# Patient Record
Sex: Male | Born: 1980 | Race: White | Hispanic: No | Marital: Single | Smoking: Never smoker
Health system: Southern US, Community
[De-identification: ages and names within clinical notes are randomized; demographics above are authoritative.]

## PROBLEM LIST (undated history)

## (undated) DIAGNOSIS — N454 Abscess of epididymis or testis: Secondary | ICD-10-CM

## (undated) DIAGNOSIS — I639 Cerebral infarction, unspecified: Secondary | ICD-10-CM

## (undated) DIAGNOSIS — I619 Nontraumatic intracerebral hemorrhage, unspecified: Secondary | ICD-10-CM

## (undated) DIAGNOSIS — M109 Gout, unspecified: Secondary | ICD-10-CM

## (undated) DIAGNOSIS — R569 Unspecified convulsions: Secondary | ICD-10-CM

## (undated) HISTORY — PX: AORTIC VALVE REPLACEMENT: SHX41

---

## 2003-07-15 ENCOUNTER — Other Ambulatory Visit: Payer: Self-pay

## 2004-04-27 ENCOUNTER — Ambulatory Visit: Payer: Self-pay | Admitting: Urology

## 2004-10-13 ENCOUNTER — Emergency Department: Payer: Self-pay | Admitting: Emergency Medicine

## 2004-10-13 ENCOUNTER — Other Ambulatory Visit: Payer: Self-pay

## 2005-02-19 ENCOUNTER — Other Ambulatory Visit: Payer: Self-pay

## 2005-02-19 ENCOUNTER — Emergency Department: Payer: Self-pay | Admitting: Emergency Medicine

## 2005-02-26 ENCOUNTER — Emergency Department: Payer: Self-pay | Admitting: Emergency Medicine

## 2005-03-04 ENCOUNTER — Emergency Department: Payer: Self-pay | Admitting: Emergency Medicine

## 2005-03-05 ENCOUNTER — Ambulatory Visit: Payer: Self-pay | Admitting: Internal Medicine

## 2006-07-06 ENCOUNTER — Emergency Department: Payer: Self-pay | Admitting: Internal Medicine

## 2006-12-29 ENCOUNTER — Emergency Department: Payer: Self-pay | Admitting: Emergency Medicine

## 2008-08-12 ENCOUNTER — Emergency Department: Payer: Self-pay | Admitting: Emergency Medicine

## 2011-01-15 ENCOUNTER — Emergency Department: Payer: Self-pay | Admitting: Emergency Medicine

## 2011-08-08 ENCOUNTER — Emergency Department: Payer: Self-pay | Admitting: Emergency Medicine

## 2011-08-08 LAB — CBC WITH DIFFERENTIAL/PLATELET
Basophil %: 0.4 %
Eosinophil #: 0.1 10*3/uL (ref 0.0–0.7)
Eosinophil %: 1 %
HCT: 43 % (ref 40.0–52.0)
HGB: 14.6 g/dL (ref 13.0–18.0)
Lymphocyte #: 1 10*3/uL (ref 1.0–3.6)
Lymphocyte %: 17.5 %
Monocyte #: 0.4 10*3/uL (ref 0.0–0.7)
Neutrophil #: 4.4 10*3/uL (ref 1.4–6.5)
Neutrophil %: 74.8 %
RBC: 5.05 10*6/uL (ref 4.40–5.90)
WBC: 5.8 10*3/uL (ref 3.8–10.6)

## 2012-07-08 ENCOUNTER — Emergency Department: Payer: Self-pay | Admitting: Emergency Medicine

## 2012-07-08 LAB — URINALYSIS, COMPLETE
Glucose,UR: NEGATIVE mg/dL (ref 0–75)
Ketone: NEGATIVE
Protein: 100
Specific Gravity: 1.017 (ref 1.003–1.030)
Squamous Epithelial: NONE SEEN

## 2012-07-08 LAB — CBC WITH DIFFERENTIAL/PLATELET
Basophil %: 0.7 %
Eosinophil #: 0 10*3/uL (ref 0.0–0.7)
Lymphocyte #: 0.9 10*3/uL — ABNORMAL LOW (ref 1.0–3.6)
Lymphocyte %: 7.3 %
MCV: 85 fL (ref 80–100)
Monocyte #: 0.8 x10 3/mm (ref 0.2–1.0)
Monocyte %: 7 %
RDW: 13.9 % (ref 11.5–14.5)

## 2012-07-08 LAB — BASIC METABOLIC PANEL
Chloride: 104 mmol/L (ref 98–107)
Co2: 25 mmol/L (ref 21–32)
EGFR (African American): >60
EGFR (Non-African Amer.): >60
Sodium: 137 mmol/L (ref 136–145)

## 2012-07-09 LAB — PROTIME-INR
INR: 9.1
Prothrombin Time: 68.9 secs — ABNORMAL HIGH (ref 11.5–14.7)

## 2012-10-18 LAB — CBC WITH DIFFERENTIAL/PLATELET
Basophil %: 1.3 %
Eosinophil #: 0.2 10*3/uL (ref 0.0–0.7)
Eosinophil %: 3.8 %
Lymphocyte #: 1.1 10*3/uL (ref 1.0–3.6)
MCH: 27.9 pg (ref 26.0–34.0)
MCV: 82 fL (ref 80–100)
Monocyte #: 0.4 x10 3/mm (ref 0.2–1.0)
Monocyte %: 6 %
Neutrophil #: 4.3 10*3/uL (ref 1.4–6.5)
Neutrophil %: 70.1 %
RBC: 4.96 10*6/uL (ref 4.40–5.90)
WBC: 6.1 10*3/uL (ref 3.8–10.6)

## 2012-10-18 LAB — URINALYSIS, COMPLETE
Ketone: NEGATIVE
Leukocyte Esterase: NEGATIVE
Nitrite: NEGATIVE
RBC,UR: 13 /HPF (ref 0–5)
Specific Gravity: 1.016 (ref 1.003–1.030)
Squamous Epithelial: NONE SEEN

## 2012-10-18 LAB — BASIC METABOLIC PANEL
BUN: 9 mg/dL (ref 7–18)
Chloride: 107 mmol/L (ref 98–107)
Co2: 27 mmol/L (ref 21–32)
Creatinine: 1.21 mg/dL (ref 0.60–1.30)
EGFR (Non-African Amer.): >60
Glucose: 101 mg/dL — ABNORMAL HIGH (ref 65–99)
Potassium: 4 mmol/L (ref 3.5–5.1)
Sodium: 140 mmol/L (ref 136–145)

## 2012-10-18 LAB — PROTIME-INR
INR: 1.2
Prothrombin Time: 15.7 secs — ABNORMAL HIGH (ref 11.5–14.7)

## 2012-10-19 ENCOUNTER — Ambulatory Visit: Payer: Self-pay | Admitting: Neurology

## 2012-10-19 ENCOUNTER — Observation Stay: Payer: Self-pay | Admitting: Specialist

## 2012-10-19 LAB — DRUG SCREEN, URINE
Amphetamines, Ur Screen: NEGATIVE (ref ?–1000)
Benzodiazepine, Ur Scrn: POSITIVE (ref ?–200)
Cannabinoid 50 Ng, Ur ~~LOC~~: NEGATIVE (ref ?–50)
Methadone, Ur Screen: NEGATIVE (ref ?–300)
Opiate, Ur Screen: NEGATIVE (ref ?–300)
Tricyclic, Ur Screen: NEGATIVE (ref ?–1000)

## 2012-10-20 LAB — CBC WITH DIFFERENTIAL/PLATELET
Basophil %: 0.9 %
Eosinophil #: 0.1 10*3/uL (ref 0.0–0.7)
Eosinophil %: 2.4 %
HCT: 37 % — ABNORMAL LOW (ref 40.0–52.0)
HGB: 12.5 g/dL — ABNORMAL LOW (ref 13.0–18.0)
MCHC: 33.7 g/dL (ref 32.0–36.0)
Monocyte #: 0.3 x10 3/mm (ref 0.2–1.0)
Monocyte %: 5.7 %
Neutrophil %: 67.2 %
Platelet: 157 10*3/uL (ref 150–440)
RDW: 12.9 % (ref 11.5–14.5)
WBC: 5.2 10*3/uL (ref 3.8–10.6)

## 2012-10-20 LAB — BASIC METABOLIC PANEL
Anion Gap: 7 (ref 7–16)
BUN: 9 mg/dL (ref 7–18)
Co2: 28 mmol/L (ref 21–32)
EGFR (African American): >60
EGFR (Non-African Amer.): >60
Osmolality: 282 (ref 275–301)
Potassium: 3.7 mmol/L (ref 3.5–5.1)
Sodium: 143 mmol/L (ref 136–145)

## 2012-10-20 LAB — URINE CULTURE

## 2012-10-20 LAB — PROTIME-INR: Prothrombin Time: 26.3 secs — ABNORMAL HIGH (ref 11.5–14.7)

## 2014-04-18 ENCOUNTER — Emergency Department: Payer: Self-pay | Admitting: Emergency Medicine

## 2014-09-03 NOTE — Consult Note (Signed)
PATIENT NAME:  Kyle Simmons, Kyle Simmons MR#:  [id number] DATE OF BIRTH:  01/13/1981  NEUROLOGY CONSULTATION  DATE OF CONSULTATION:  10/19/2012  CONSULTING PHYSICIAN:  Ajmal Kathan B. Alger SimonsSwisher, MD  REFERRING PHYSICIAN: Dr. Huey Bienenstockawood Elgergawy.  REASON FOR CONSULTATION:  Seizures.   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 He is followed by Dr. Sherryll BurgerShah as an outpatient for his seizures. He was previously on Dilantin for quite some time. However over the winter Dr. Sherryll BurgerShah transitioned him off Dilantin and onto Keppra. The patient is not sure why this was done. Apparently Dilantin had  given him good control of his generalized seizures, but he continued to have complex-partial seizures. Since he is also on Coumadin, there is a possibility that Dr. Sherryll BurgerShah wanted to transition him away from a medication that would have had an interaction with Coumadin. However, the patient did say that he did not have much trouble with his Coumadin levels in the past.   In any case he did have a generalized seizure in February, and Dr. Sherryll BurgerShah increased his Keppra and also added on a second antiepileptic drug. The patient did not get that filled due to insurance reasons. Apparently he does have insurance, but there were some problems with his coverage. He had had a second generalized tonic-clonic seizure on April 1st. He continues on Keppra 1500 mg b.i.d. and states that he is very compliant with that. He does not know the medication name that Dr. Sherryll BurgerShah wanted to start back in February. He states that sometimes he gets an aura with his seizures, but oftentimes not. His mother states that his complex-partial seizures  consist of him "spacing out." With his generalized tonic-clonic seizures he has neck extension, eyes rolling back into his head, flailing of his extremities. He also has a significant amount of postictal confusion.   In the Emergency Department the patient had a seizure after being loaded with 1500 mg of p.o. Keppra. After the seizure in the Emergency Department he was loaded on fosphenytoin.   REVIEW OF SYSTEMS: A complete 14-point review of systems is negative other than what is mentioned in the HPI.   PAST MEDICAL HISTORY:  1.  Congenital bicuspid aortic valve, status post prosthetic valve replacement, 1993.  2.  Left MCA infarct, 1996, felt to be an embolic event from a fall.  3.  Intracranial bleed in the past.  4.  Complex-partial seizures.   PAST SURGICAL HISTORY: Aortic valve replacement, age of 34.   HOME MEDICATIONS: Aspirin 81 mg daily, Coumadin, Tylenol, Keppra 1500 mg b.i.d.   ALLERGIES: No drug allergies.   FAMILY HISTORY: No family history of seizures.   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 No smoking, alcohol, or illicits.   PHYSICAL EXAMINATION: VITAL SIGNS: Temperature 98, pulse 70, respirations 18, blood pressure 93/54, pulse ox 95%  on room air.  GENERAL: Supine, well, no apparent distress.  CARDIOVASCULAR: Regular rate and rhythm.  RESPIRATORY: Clear anteriorly.  ABDOMEN: Soft.  SKIN: No rashes or lesions.   MENTAL STATUS: Alert and oriented x 4. Naming and repetition intact. Speech is fluent. No dysarthria.  CRANIAL NERVES: Pupils equal, round, and reactive to light. Full visual fields. Extraocular movements intact. No nystagmus. No facial  sensory deficits. Facial expressions are symmetric. Shoulder-shrug 5/5 bilaterally. Tongue midline.  MOTOR: No pronator drift. Strength is 5/5 throughout.  DEEP TENDON REFLEXES: 2+/4 throughout, with toes downgoing bilaterally.  COORDINATION: No ataxia or dysmetria with bilateral finger-nose or  heel-to-shin.  SENSATION: No deficits to light touch, temperature, pin prick throughout.  GAIT: Not assessed.   LABS: Urine tox screen is positive only for benzodiazepines.   Cardiac enzymes are negative.   INR is 1.2.   Urinalysis is negative.   BMP is within normal limits.   CBC is within normal limits.   IMAGING: Noncontrast head CT reveals a left parieto-occipital infarct with encephalomalacia that is chronic. There is no acute intracranial process.   Kyle Simmons is a 34 year old male who has a past medical history of a left MCA territory infarct, complex-partial seizures with and without secondary generalization, who presents with breakthrough seizures. I suspect that the etiology of the seizures is likely secondary to the cerebral infarction that he had in the past.   Reason for breakthrough seizures is unknown at this time given that he states he is compliant with Keppra and there is no evidence of any infection. I suspected that he likely has poor seizure control from Keppra monotherapy.   RECOMMENDATIONS: 1.  Initiate Depakote, with a loading dose of 1800 mg IV x 1 and maintenance dosing of 500 mg b.i.d.  2.  Continue Keppra 1500 mg b.i.d.  3.  He was reminded of the [place] law stating that he cannot drive until he is 6 months seizure-free and not until he has been cleared by the [facility].  4.  Continue Coumadin, with management by the hospitalist service.  5. He will need followup with Dr. Sherryll Burger in 1 to 2 weeks.  6.  He could potentially be discharged later today if he is eating and ambulating well.   Thank you for this consultation.    ____________________________ Sid Falcon Ireland Virrueta, MD cbs:dm D: 10/19/2012 12:14:24 ET T: 10/19/2012 14:19:28 ET JOB#: [id number]  cc: Fredrik Mogel B. Alger Simons, MD, <Dictator> Trellis Paganini MD ELECTRONICALLY SIGNED 11/18/2012 14:12

## 2014-09-03 NOTE — H&P (Signed)
PATIENT NAME:  Kyle Simmons, Kyle Simmons MR#:  [id number] DATE OF BIRTH:  10-07-1980  DATE OF ADMISSION:  10/19/2012  REFERRING PHYSICIAN:  Rebecka Apley, MD  PRIMARY CARE PHYSICIAN:  Jorje Guild. Beckey Downing, MD    CHIEF COMPLAINT:  Seizure.   HISTORY OF PRESENT ILLNESS: This is a 34 year old male with significant past medical history of complex partial seizures, history of congenital bicuspid aortic valve with prosthetic valve replacement in 1996, on warfarin, history of left MCA infarction in 1996, history of intracranial bleed in the past, who presents with seizures. The patient following with Dr. Cristopher Peru for his seizures. Reports he did have seizure this evening which prompted his mother to bring him to the ED. The patient reports his past seizure was on April 1st; he was seizure free for the last 2 months. As well prior to that, he had seizures in February where he had his Keppra increased by Dr. Sherryll Burger in February. The patient reports that he was prescribed a new medicine by Dr. Sherryll Burger, but he did not bring it due to financial reason as currently he does not have insurance as well. While in the ED, the patient received initially 1500 of p.o. Keppra in the ED, but the patient did have another episode of seizure in the ED, where he was loaded with Cerebyx (fosphenytoin), and hospitalist service was requested to admit the patient due to his recurrent seizures. As well, the patient is known to have history of congenital bicuspid aortic valve with bicuspid aortic valve replacement in 1993, which since he has been on anticoagulation, where he is followed by Dr. Darrold Junker. The patient's INR was noticed to be subtherapeutic at 1.2. The patient reports he had his INR recently adjusted as he had supratherapeutic INR where his warfarin was decreased from 5 mg p.o. daily to 5 mg p.o. 2 times a week and the rest of the week he is on 2.5, but the patient reports due to financial problems he has not been following with that as  well. As well, he was supposed to follow Dr. Sherryll Burger last week, where he did not due to financial problems. The patient has a history of generalized seizures in the past, but it has not been for a while now, but the patient reports since he has been started on Dilantin since last December, he had them a couple of times, where he was at [facility] today, he had a generalized tonic-clonic seizures as the report. Unfortunately, the seizure episode in the ED was not witnessed, but the patient was noticed to be post ictal.   PAST MEDICAL HISTORY: 1.  Congenital bicuspid aortic valve status post prosthetic valve replacement, 1993.  2.  Left MCA infarct, 1996, felt to be due to embolic event from his mechanical fall.   3.  Intracranial bleed in the past.  4.  Complex partial seizures without secondary generalization since around 2003.   PAST SURGICAL HISTORY:  Aortic valve replacement at the age of 10.   HOME MEDICATIONS: 1.  Aspirin 81 mg daily.  2.  Warfarin 5 mg oral twice a week and 2.5 mg oral the rest of the week.  3.  Tylenol as needed.  4.  Keppra 1500 mg oral 2 times a day.   ALLERGIES:  No known drug allergies.   FAMILY HISTORY:  Significant for hypertension. Father has cancer and emphysema and diabetes.   SOCIAL HISTORY:  The patient works as a Pharmacologist part time.  He does not have  health insurance. The patient does not smoke, does not drink alcohol. Denies any illicit drug use, as well, denies any driving.   REVIEW OF SYSTEMS:   CONSTITUTIONAL:  The patient denies fever, chills, fatigue, weakness.  EYES: Denies blurry vision, double vision, pain, inflammation.  ENT: Denies tinnitus, ear pain, epistaxis or discharge.  RESPIRATORY: Denies cough, wheezing, hemoptysis or dyspnea.  CARDIOVASCULAR: Denies chest pain, edema, palpitations, syncope. GASTROINTESTINAL: Denies nausea, vomiting, diarrhea, abdominal pain, rectal bleed, melena or coffee-ground emesis.  GENITOURINARY: Denies  dysuria, hematuria or renal colic. Denies any urinary incontinence or stool incontinence.  ENDOCRINE: Denies polyuria, polydipsia, heat or cold intolerance.  HEMATOLOGY: Denies anemia, easy bruising, bleeding diathesis.   INTEGUMENTARY: Denies any acne, rash or skin lesions.  MUSCULOSKELETAL: Denies any swelling, gout, redness, limited activity, arthritis. NEUROLOGIC: Denies, vertigo, ataxia, dementia, dysarthria. Has [facility].  PSYCHIATRIC: Denies anxiety, insomnia, schizophrenia, bipolar disorder.   PHYSICAL EXAMINATION: VITAL SIGNS: Temperature 98, pulse 82, respiratory rate 18, blood pressure 106/68, saturating 100% on room air.  GENERAL: Well-nourished male who looks comfortable in bed, in no apparent distress. HEENT: Head is atraumatic, normocephalic. Pupils equal, reactive to light, pink conjunctivae, anicteric sclerae. Moist oral mucosa.  NECK: Supple. No thyromegaly. No JVD.  CHEST: Good air entry bilaterally. No wheezing, rales, rhonchi.  CARDIOVASCULAR: S1, S2 heard. No rubs, murmurs, gallops. Has systolic click.  ABDOMEN: Soft, nontender, nondistended. Bowel sounds present.  EXTREMITIES: No edema. No clubbing. No cyanosis.  PSYCHIATRIC: Appropriate affect. Awake, alert x 3. Intact judgment and insight.  NEUROLOGICAL: Cranial nerves grossly intact. Motor strength 5/5. No motor or sensory deficits.  SKIN: Normal skin turgor. Warm and dry.   LABORATORY DATA:  Pertinent labs: Glucose 101, BUN 9, creatinine 1.21, sodium 140, potassium 4, chloride 107, CO2 27, anion gap 6. Troponin less than 0.02. Urine drugs, positive for benzodiazepines. White blood cells 6.1, hemoglobin 13.8, hematocrit 40.8, platelets 184. INR 1.2. Urinalysis negative for leukocyte esterase and nitrite and white blood cells none seen, +3 bacteria.   EKG: Showing sinus rhythm with tachycardia at 112 beats per minute. Had significant abnormalities, which include some T wave  inversions in the lateral leads and inferior leads, but once compared to the old EKG, these all appear to be chronic and old.   ASSESSMENT AND PLAN: 1.  Seizures.  The patient presents today with recurrent seizures, [facility] which appeared to be tonic-clonic seizure, and had another episode in the ED. Apparently the notes have described that the patient was noticed to be postictal, already loaded with 1500 of p.o. Keppra and loaded with IV Cerebyx. Due to his recurrent seizures, the patient will be admitted, will be kept on seizure precaution, will consult neurology service. At this point, will not order EEG  as I do not think this will add anything, as he is known to have history of seizures. We will have neurology evaluate the patient to give recommendation for his medication, as the patient was supposed to start new meds but he did not due to financial reasons.  2.  Prosthetic aortic valve with subtherapeutic INR. The patient will be started on subcutaneous Lovenox 1 mg/kg every 12 hours until his INR is therapeutic.  3.  History of left middle cerebral artery infarction in 1996. The patient will be continued on aspirin.  4.  Deep vein thrombosis prophylaxis. The patient is on full dose anticoagulation.  5.  Patient is full code. 6.  We  will consult social worker for assistance with  medications at home.   Total time spent on admission and patient care:  Fifty minutes.    ____________________________ Starleen Arms, MD dse:nts D: 10/19/2012 03:42:13 ET T: 10/19/2012 04:41:34 ET JOB#: [id number]  cc: Starleen Arms, MD, <Dictator> Yuta Cipollone Teena Irani MD ELECTRONICALLY SIGNED 10/22/2012 0:40

## 2014-09-03 NOTE — Discharge Summary (Signed)
PATIENT NAME:  Kyle Simmons, Kyle Simmons MR#:  [id number] DATE OF BIRTH:  26-Apr-1981  DATE OF ADMISSION:  10/19/2012 DATE OF DISCHARGE:  10/20/2012  HISTORY AND PHYSICAL:  For a detailed note, please take a look at the history and physical done on admission by Dr. Randol KernElgergawy.   DISCHARGE DIAGNOSES: As follows: 1.  Recurrent seizures, tonic-clonic in nature.  2.  History of a congenital bicuspid aortic valve, status post aortic valve replacement.   DIET: The patient is being discharged on a regular diet.   ACTIVITY: As tolerated.   FOLLOWUP: With Dr. Cristopher PeruHemang Shah in the next 1 to 2 weeks. Also, follow up with Dr. Barry BrunnerGlenn Willett in the next 1 to 2 weeks.    DISCHARGE MEDICATIONS:  Keppra 1500 mg b.i.d., aspirin 81 mg daily, Coumadin 4 mg daily and valproic acid 500 mg b.i.d.   CONSULTANTS DURING THE HOSPITAL COURSE: Dr. Mont Duttonhrista Swisher from neurology.  PERTINENT STUDIES DONE DURING THE HOSPITAL COURSE: A CT scan of the head done without contrast on admission showing no acute intracranial process. A chest x-ray also done on admission showing no acute cardiopulmonary disease of the chest.   BRIEF HOSPITAL COURSE: This is a 34 year old male with medical problems as mentioned above, presented to the hospital due to recurrent tonic-clonic seizures.   PROBLEM LIST: 1.  Recurrent seizures: The patient apparently has a history of epilepsy, is already on p.o. Keppra, is followed by Dr. Sherryll BurgerShah from neurology. The patient was admitted to the hospital, re-loaded with IV Keppra and also started on valproic acid. The patient was also loaded with valproic acid. A neurology consult was obtained. The patient was seen by Dr. Alger SimonsSwisher. She recommended adding an antiepileptic, which was done. At this point over the past 48 hours, the patient has had no further seizure-type activity. His CT head was negative as mentioned. He is being discharged on p.o. Keppra and valproic acid with close followup with Dr. Cristopher PeruHemang Shah as an  outpatient.  2.  History of bicuspid aortic valve, status post aortic valve replacement:  The patient currently is on Coumadin. His INR was subtherapeutic when he presented. His Coumadin dose was adjusted. His INR is actually therapeutic at 2.5 upon discharge. The patient follows up with Dr. Darrold JunkerParaschos.  He will continue followup with him and follow his PT/INR checks. He was bridged with Lovenox while he was here in the hospital.   CODE STATUS: The patient is a FULL CODE.     DISPOSITION: He is being discharged home.   TIME SPENT: 40 minutes.   ____________________________ [name]. Cherlynn KaiserSainani, MD vjs:cb D: 10/20/2012 16:16:15 ET T: 10/20/2012 23:47:53 ET JOB#: [id number]  cc: [name]. Cherlynn KaiserSainani, MD, <Dictator> Jorje GuildGlenn R. Beckey DowningWillett, MD Hemang K. Sherryll BurgerShah, MD Houston SirenVIVEK Simmons Ayrianna Mcginniss MD ELECTRONICALLY SIGNED 10/31/2012 20:34

## 2014-11-18 ENCOUNTER — Encounter: Payer: Self-pay | Admitting: Urgent Care

## 2014-11-18 ENCOUNTER — Emergency Department
Admission: EM | Admit: 2014-11-18 | Discharge: 2014-11-18 | Disposition: A | Discharge status: Home / Self Care | Payer: No Typology Code available for payment source | Attending: Emergency Medicine | Admitting: Emergency Medicine

## 2014-11-18 DIAGNOSIS — M79671 Pain in right foot: Secondary | ICD-10-CM | POA: Diagnosis present

## 2014-11-18 DIAGNOSIS — M722 Plantar fascial fibromatosis: Secondary | ICD-10-CM | POA: Diagnosis not present

## 2014-11-18 HISTORY — DX: Cerebral infarction, unspecified: I63.9

## 2014-11-18 HISTORY — DX: Abscess of epididymis or testis: N45.4

## 2014-11-18 HISTORY — DX: Unspecified convulsions: R56.9

## 2014-11-18 MED ORDER — PREDNISONE 20 MG PO TABS: 40.0000 mg | ORAL_TABLET | Freq: Every day | ORAL | Status: DC

## 2014-11-18 MED ORDER — OXYCODONE-ACETAMINOPHEN 5-325 MG PO TABS: 1.0000 | ORAL_TABLET | Freq: Four times a day (QID) | ORAL | Status: DC | PRN

## 2014-11-18 NOTE — ED Notes (Signed)
Patient presents with c/o RIGHT foot pain (plantar surface) since Sunday. Denies injury. NOS reported at this time.

## 2014-11-18 NOTE — Discharge Instructions (Signed)
Plantar Fasciitis Plantar fasciitis is a common condition that causes foot pain. It is soreness (inflammation) of the band of tough fibrous tissue on the bottom of the foot that runs from the heel bone (calcaneus) to the ball of the foot. The cause of this soreness may be from excessive standing, poor fitting shoes, running on hard surfaces, being overweight, having an abnormal walk, or overuse (this is common in runners) of the painful foot or feet. It is also common in aerobic exercise dancers and ballet dancers. SYMPTOMS  Most people with plantar fasciitis complain of:  Severe pain in the morning on the bottom of their foot especially when taking the first steps out of bed. This pain recedes after a few minutes of walking.  Severe pain is experienced also during walking following a long period of inactivity.  Pain is worse when walking barefoot or up stairs DIAGNOSIS   Your caregiver will diagnose this condition by examining and feeling your foot.  Special tests such as X-rays of your foot, are usually not needed. PREVENTION   Consult a sports medicine professional before beginning a new exercise program.  Walking programs offer a good workout. With walking there is a lower chance of overuse injuries common to runners. There is less impact and less jarring of the joints.  Begin all new exercise programs slowly. If problems or pain develop, decrease the amount of time or distance until you are at a comfortable level.  Wear good shoes and replace them regularly.  Stretch your foot and the heel cords at the back of the ankle (Achilles tendon) both before and after exercise.  Run or exercise on even surfaces that are not hard. For example, asphalt is better than pavement.  Do not run barefoot on hard surfaces.  If using a treadmill, vary the incline.  Do not continue to workout if you have foot or joint problems. Seek professional help if they do not improve. HOME CARE INSTRUCTIONS     Avoid activities that cause you pain until you recover.  Use ice or cold packs on the problem or painful areas after working out.  Only take over-the-counter or prescription medicines for pain, discomfort, or fever as directed by your caregiver.  Soft shoe inserts or athletic shoes with air or gel sole cushions may be helpful.  If problems continue or become more severe, consult a sports medicine caregiver or your own health care provider. Cortisone is a potent anti-inflammatory medication that may be injected into the painful area. You can discuss this treatment with your caregiver. MAKE SURE YOU:   Understand these instructions.  Will watch your condition.  Will get help right away if you are not doing well or get worse. Document Released: 01/23/2001 Document Revised: 07/23/2011 Document Reviewed: 03/24/2008 [facility] Patient Information 2015 [employer] [place]. This information is not intended to replace advice given to you by your health care provider. Make sure you discuss any questions you have with your health care provider.   You were prescribed a medication that is potentially sedating. Do not drink alcohol, drive or participate in any other potentially dangerous activities while taking this medication as it may make you sleepy. Do not take this medication with any other sedating medications, either prescription or over-the-counter. If you were prescribed Percocet or Vicodin, do not take these with acetaminophen (Tylenol) as it is already contained within these medications.   Opioid pain medications (or "narcotics") can be habit forming.  Use it as little as possible to  achieve adequate pain control.  Do not use or use it with extreme caution if you have a history of opiate abuse or dependence.  If you are on a pain contract with your primary care doctor or a pain specialist, be sure to let them know you were prescribed this medication today from the [facility]  Department.  This medication is intended for your use only - do not give any to anyone else and keep it in a secure place where nobody else, especially children and pets, have access to it.  It will also cause or worsen constipation, so you may want to consider taking an over-the-counter stool softener while you are taking this medication.

## 2014-11-18 NOTE — ED Notes (Signed)
Pt informed to return if any life threatening symptoms occur.  

## 2014-11-18 NOTE — ED Provider Notes (Signed)
[facility] Emergency Department Provider Note  ____________________________________________  Time seen: 7:15 AM  I have reviewed the triage vital signs and the nursing notes.   HISTORY  Chief Complaint Foot Pain    HPI Kyle Simmons. is a 34 y.o. male who complains of pain of the right foot for the past 4 days. Has been gradual onset and worsening with standing and walking, particularly with the transition of his way to his forefoot and stepping from the ball of his foot. He has constant throbbing moderate intensity pain in the right plantar foot, with swelling. No injuries falls trips lacerations or puncture wounds. No change in shoes. No recent increase in activity or type of activity.     Past Medical History  Diagnosis Date  . Stroke   . Seizures   . Testicular abscess     There are no active problems to display for this patient.   Past Surgical History  Procedure Laterality Date  . Aortic valve replacement      Current Outpatient Rx  Name  Route  Sig  Dispense  Refill  . oxyCODONE-acetaminophen (ROXICET) 5-325 MG per tablet   Oral   Take 1 tablet by mouth every 6 (six) hours as needed for severe pain.   12 tablet   0   . predniSONE (DELTASONE) 20 MG tablet   Oral   Take 2 tablets (40 mg total) by mouth daily.   8 tablet   0     Allergies Cortisone; Sudafed; and Tramadol NSAIDs No family history on file.  Social History History  Substance Use Topics  . Smoking status: Never Smoker   . Smokeless tobacco: Not on file  . Alcohol Use: No    Review of Systems  Constitutional: No fever or chills. No weight changes Eyes:No blurry vision or double vision.  ENT: No sore throat. Cardiovascular: No chest pain. Respiratory: No dyspnea or cough. Gastrointestinal: Negative for abdominal pain, vomiting and diarrhea.  No BRBPR or melena. Genitourinary: Negative for dysuria, urinary retention, bloody urine, or difficulty  urinating. Musculoskeletal: Negative for back pain. No joint swelling or pain. Right plantar foot pain as above Skin: Negative for rash. Neurological: Negative for headaches, focal weakness or numbness. Psychiatric:No anxiety or depression.   Endocrine:No hot/cold intolerance, changes in energy, or sleep difficulty.  10-point ROS otherwise negative.  ____________________________________________   PHYSICAL EXAM:  VITAL SIGNS: ED Triage Vitals  Enc Vitals Group     BP --      Pulse --      Resp --      Temp --      Temp src --      SpO2 --      Weight --      Height --      Head Cir --      Peak Flow --      Pain Score 11/18/14 0147 7     Pain Loc --      Pain Edu? --      Excl. in GC? --      Constitutional: Alert and oriented. Well appearing and in no distress. Musculoskeletal: Right foot with diffuse swelling and mild tenderness of the right plantar foot. There is no bony tenderness. There is no erythema warmth and induration and fluctuance or drainage or focal tenderness. Ankle joint and proximal right leg are normal. MTP joints are normal and toes are normal.  Neurologic:   Normal speech and language.  CN 2-10 normal. Motor grossly intact. No pronator drift.  Normal gait. No gross focal neurologic deficits are appreciated.  Skin:  Skin is warm, dry and intact. No rash noted.  No petechiae, purpura, or bullae.  ____________________________________________    LABS (pertinent positives/negatives) (all labs ordered are listed, but only abnormal results are displayed) Labs Reviewed - No data to display ____________________________________________   EKG    ____________________________________________    RADIOLOGY    ____________________________________________   PROCEDURES  ____________________________________________   INITIAL IMPRESSION / ASSESSMENT AND PLAN / ED COURSE  Pertinent labs & imaging results that were available during my care of  the patient were reviewed by me and considered in my medical decision making (see chart for details).  Patient presents with clinical plantar fasciitis. This is so early in the course he is unable to take tramadol or NSAIDs, so we'll give him a short course of prednisone and Percocet. We'll also provide an Ace wrap and postop shoe for comfort and to relieve the stress on his entire foot. I recommended he follow up with podiatry, who can further evaluate his symptoms and response to treatment and determine the need for custom fitted orthotics. No evidence of fracture or infection.  ____________________________________________   FINAL CLINICAL IMPRESSION(S) / ED DIAGNOSES  Final diagnoses:  Plantar fasciitis of right foot      Sharman CheekPhillip Jamus Loving, MD 11/18/14 [phone]

## 2014-11-18 NOTE — ED Notes (Addendum)
Pt was placed into a room and not moved on the board , disregard earlier dispo noted,

## 2016-05-21 DIAGNOSIS — Z952 Presence of prosthetic heart valve: Secondary | ICD-10-CM | POA: Diagnosis not present

## 2016-05-21 DIAGNOSIS — Z0000 Encounter for general adult medical examination without abnormal findings: Secondary | ICD-10-CM | POA: Diagnosis not present

## 2016-05-21 DIAGNOSIS — Q231 Congenital insufficiency of aortic valve: Secondary | ICD-10-CM | POA: Diagnosis not present

## 2016-05-21 DIAGNOSIS — Z8673 Personal history of transient ischemic attack (TIA), and cerebral infarction without residual deficits: Secondary | ICD-10-CM | POA: Diagnosis not present

## 2016-07-11 DIAGNOSIS — Z0000 Encounter for general adult medical examination without abnormal findings: Secondary | ICD-10-CM | POA: Diagnosis not present

## 2016-07-25 DIAGNOSIS — L723 Sebaceous cyst: Secondary | ICD-10-CM | POA: Diagnosis not present

## 2016-07-25 DIAGNOSIS — J019 Acute sinusitis, unspecified: Secondary | ICD-10-CM | POA: Diagnosis not present

## 2016-10-04 DIAGNOSIS — Z0000 Encounter for general adult medical examination without abnormal findings: Secondary | ICD-10-CM | POA: Diagnosis not present

## 2016-10-04 DIAGNOSIS — Z952 Presence of prosthetic heart valve: Secondary | ICD-10-CM | POA: Diagnosis not present

## 2016-11-19 DIAGNOSIS — Z952 Presence of prosthetic heart valve: Secondary | ICD-10-CM | POA: Diagnosis not present

## 2016-11-30 DIAGNOSIS — Z8673 Personal history of transient ischemic attack (TIA), and cerebral infarction without residual deficits: Secondary | ICD-10-CM | POA: Diagnosis not present

## 2016-11-30 DIAGNOSIS — Z954 Presence of other heart-valve replacement: Secondary | ICD-10-CM | POA: Diagnosis not present

## 2016-11-30 DIAGNOSIS — Q231 Congenital insufficiency of aortic valve: Secondary | ICD-10-CM | POA: Diagnosis not present

## 2016-12-10 DIAGNOSIS — Z952 Presence of prosthetic heart valve: Secondary | ICD-10-CM | POA: Diagnosis not present

## 2016-12-24 DIAGNOSIS — M25469 Effusion, unspecified knee: Secondary | ICD-10-CM | POA: Diagnosis not present

## 2016-12-24 DIAGNOSIS — M25462 Effusion, left knee: Secondary | ICD-10-CM | POA: Diagnosis not present

## 2016-12-24 DIAGNOSIS — M109 Gout, unspecified: Secondary | ICD-10-CM | POA: Diagnosis not present

## 2016-12-24 DIAGNOSIS — M25362 Other instability, left knee: Secondary | ICD-10-CM | POA: Diagnosis not present

## 2016-12-27 DIAGNOSIS — M25462 Effusion, left knee: Secondary | ICD-10-CM | POA: Diagnosis not present

## 2017-01-02 DIAGNOSIS — Z0000 Encounter for general adult medical examination without abnormal findings: Secondary | ICD-10-CM | POA: Diagnosis not present

## 2017-01-08 DIAGNOSIS — Z79899 Other long term (current) drug therapy: Secondary | ICD-10-CM | POA: Diagnosis not present

## 2017-01-21 DIAGNOSIS — Z9229 Personal history of other drug therapy: Secondary | ICD-10-CM | POA: Diagnosis not present

## 2017-01-21 DIAGNOSIS — M25562 Pain in left knee: Secondary | ICD-10-CM | POA: Diagnosis not present

## 2017-01-21 DIAGNOSIS — Z23 Encounter for immunization: Secondary | ICD-10-CM | POA: Diagnosis not present

## 2017-01-21 DIAGNOSIS — S61209A Unspecified open wound of unspecified finger without damage to nail, initial encounter: Secondary | ICD-10-CM | POA: Diagnosis not present

## 2017-01-23 DIAGNOSIS — M25562 Pain in left knee: Secondary | ICD-10-CM | POA: Diagnosis not present

## 2017-01-23 DIAGNOSIS — M25062 Hemarthrosis, left knee: Secondary | ICD-10-CM | POA: Diagnosis not present

## 2017-01-24 ENCOUNTER — Other Ambulatory Visit: Payer: Self-pay | Admitting: Physician Assistant

## 2017-01-24 DIAGNOSIS — M25062 Hemarthrosis, left knee: Secondary | ICD-10-CM

## 2017-02-01 ENCOUNTER — Ambulatory Visit: Admission: RE | Admit: 2017-02-01 | Payer: 59 | Source: Ambulatory Visit

## 2017-02-07 DIAGNOSIS — M25062 Hemarthrosis, left knee: Secondary | ICD-10-CM | POA: Diagnosis not present

## 2017-02-11 DIAGNOSIS — Z0000 Encounter for general adult medical examination without abnormal findings: Secondary | ICD-10-CM | POA: Diagnosis not present

## 2017-02-11 DIAGNOSIS — M25062 Hemarthrosis, left knee: Secondary | ICD-10-CM | POA: Diagnosis not present

## 2017-02-14 DIAGNOSIS — M25062 Hemarthrosis, left knee: Secondary | ICD-10-CM | POA: Diagnosis not present

## 2017-02-21 DIAGNOSIS — M25062 Hemarthrosis, left knee: Secondary | ICD-10-CM | POA: Diagnosis not present

## 2017-03-12 DIAGNOSIS — Z0000 Encounter for general adult medical examination without abnormal findings: Secondary | ICD-10-CM | POA: Diagnosis not present

## 2017-04-26 DIAGNOSIS — Z0000 Encounter for general adult medical examination without abnormal findings: Secondary | ICD-10-CM | POA: Diagnosis not present

## 2017-05-21 DIAGNOSIS — Z0000 Encounter for general adult medical examination without abnormal findings: Secondary | ICD-10-CM | POA: Diagnosis not present

## 2017-06-03 DIAGNOSIS — Q231 Congenital insufficiency of aortic valve: Secondary | ICD-10-CM | POA: Diagnosis not present

## 2017-06-03 DIAGNOSIS — I671 Cerebral aneurysm, nonruptured: Secondary | ICD-10-CM | POA: Diagnosis not present

## 2017-06-03 DIAGNOSIS — Z8673 Personal history of transient ischemic attack (TIA), and cerebral infarction without residual deficits: Secondary | ICD-10-CM | POA: Diagnosis not present

## 2017-07-09 DIAGNOSIS — Z0000 Encounter for general adult medical examination without abnormal findings: Secondary | ICD-10-CM | POA: Diagnosis not present

## 2017-08-07 DIAGNOSIS — Z0000 Encounter for general adult medical examination without abnormal findings: Secondary | ICD-10-CM | POA: Diagnosis not present

## 2017-08-26 DIAGNOSIS — J069 Acute upper respiratory infection, unspecified: Secondary | ICD-10-CM | POA: Diagnosis not present

## 2017-08-26 DIAGNOSIS — S76211A Strain of adductor muscle, fascia and tendon of right thigh, initial encounter: Secondary | ICD-10-CM | POA: Diagnosis not present

## 2017-08-26 DIAGNOSIS — R350 Frequency of micturition: Secondary | ICD-10-CM | POA: Diagnosis not present

## 2017-09-01 DIAGNOSIS — M109 Gout, unspecified: Secondary | ICD-10-CM | POA: Diagnosis not present

## 2017-09-28 DIAGNOSIS — M109 Gout, unspecified: Secondary | ICD-10-CM | POA: Diagnosis not present

## 2017-10-17 DIAGNOSIS — Z0000 Encounter for general adult medical examination without abnormal findings: Secondary | ICD-10-CM | POA: Diagnosis not present

## 2018-01-08 DIAGNOSIS — Z87898 Personal history of other specified conditions: Secondary | ICD-10-CM | POA: Diagnosis not present

## 2018-01-08 DIAGNOSIS — Z79899 Other long term (current) drug therapy: Secondary | ICD-10-CM | POA: Diagnosis not present

## 2018-01-08 DIAGNOSIS — Z0000 Encounter for general adult medical examination without abnormal findings: Secondary | ICD-10-CM | POA: Diagnosis not present

## 2019-01-12 ENCOUNTER — Encounter: Payer: Self-pay | Admitting: Emergency Medicine

## 2019-01-12 ENCOUNTER — Other Ambulatory Visit: Payer: Self-pay

## 2019-01-12 DIAGNOSIS — G43109 Migraine with aura, not intractable, without status migrainosus: Secondary | ICD-10-CM | POA: Diagnosis not present

## 2019-01-12 DIAGNOSIS — Z8673 Personal history of transient ischemic attack (TIA), and cerebral infarction without residual deficits: Secondary | ICD-10-CM | POA: Insufficient documentation

## 2019-01-12 DIAGNOSIS — R42 Dizziness and giddiness: Secondary | ICD-10-CM | POA: Diagnosis present

## 2019-01-12 LAB — CBC WITH DIFFERENTIAL/PLATELET
Abs Immature Granulocytes: 0.02 10*3/uL (ref 0.00–0.07)
Basophils Absolute: 0 10*3/uL (ref 0.0–0.1)
Basophils Relative: 1 %
Eosinophils Absolute: 0.1 10*3/uL (ref 0.0–0.5)
Eosinophils Relative: 1 %
HCT: 42.6 % (ref 39.0–52.0)
Hemoglobin: 14.1 g/dL (ref 13.0–17.0)
Immature Granulocytes: 0 %
Lymphocytes Relative: 25 %
Lymphs Abs: 1.5 10*3/uL (ref 0.7–4.0)
MCH: 28.1 pg (ref 26.0–34.0)
MCHC: 33.1 g/dL (ref 30.0–36.0)
MCV: 84.9 fL (ref 80.0–100.0)
Monocytes Absolute: 0.4 10*3/uL (ref 0.1–1.0)
Monocytes Relative: 6 %
Neutro Abs: 4 10*3/uL (ref 1.7–7.7)
Neutrophils Relative %: 67 %
Platelets: 234 10*3/uL (ref 150–400)
RBC: 5.02 MIL/uL (ref 4.22–5.81)
RDW: 12 % (ref 11.5–15.5)
WBC: 5.9 10*3/uL (ref 4.0–10.5)
nRBC: 0 % (ref 0.0–0.2)

## 2019-01-12 NOTE — ED Triage Notes (Signed)
Patient ambulatory to triage with steady gait, without difficulty or distress noted; pt reports dizziness tonight; st 10 second episode of "numbness to hands"; denies any accomp symptoms at present; st coumadin dosage recently adjusted

## 2019-01-13 ENCOUNTER — Encounter: Payer: Self-pay | Admitting: Emergency Medicine

## 2019-01-13 ENCOUNTER — Emergency Department: Payer: 59

## 2019-01-13 ENCOUNTER — Emergency Department
Admission: EM | Admit: 2019-01-13 | Discharge: 2019-01-13 | Disposition: A | Discharge status: Home / Self Care | Payer: 59 | Attending: Emergency Medicine | Admitting: Emergency Medicine

## 2019-01-13 DIAGNOSIS — G43109 Migraine with aura, not intractable, without status migrainosus: Secondary | ICD-10-CM

## 2019-01-13 HISTORY — DX: Cerebral infarction, unspecified: I63.9

## 2019-01-13 HISTORY — DX: Nontraumatic intracerebral hemorrhage, unspecified: I61.9

## 2019-01-13 LAB — COMPREHENSIVE METABOLIC PANEL
ALT: 17 U/L (ref 0–44)
AST: 22 U/L (ref 15–41)
Albumin: 5.2 g/dL — ABNORMAL HIGH (ref 3.5–5.0)
Alkaline Phosphatase: 55 U/L (ref 38–126)
Anion gap: 10 (ref 5–15)
BUN: 18 mg/dL (ref 6–20)
CO2: 30 mmol/L (ref 22–32)
Calcium: 9.8 mg/dL (ref 8.9–10.3)
Chloride: 101 mmol/L (ref 98–111)
Creatinine, Ser: 1.33 mg/dL — ABNORMAL HIGH (ref 0.61–1.24)
GFR calc Af Amer: >60 mL/min (ref >60)
GFR calc non Af Amer: >60 mL/min (ref >60)
Glucose, Bld: 114 mg/dL — ABNORMAL HIGH (ref 70–99)
Potassium: 4.6 mmol/L (ref 3.5–5.1)
Sodium: 141 mmol/L (ref 135–145)
Total Bilirubin: 0.8 mg/dL (ref 0.3–1.2)
Total Protein: 7.4 g/dL (ref 6.5–8.1)

## 2019-01-13 LAB — PROTIME-INR
INR: 2.3 — ABNORMAL HIGH (ref 0.8–1.2)
Prothrombin Time: 25.3 seconds — ABNORMAL HIGH (ref 11.4–15.2)

## 2019-01-13 LAB — TROPONIN I (HIGH SENSITIVITY)
Troponin I (High Sensitivity): 7 ng/L (ref <18)
Troponin I (High Sensitivity): 8 ng/L (ref <18)

## 2019-01-13 MED ORDER — SUMATRIPTAN 20 MG/ACT NA SOLN: 1.0000 | NASAL | 2 refills | Status: DC | PRN

## 2019-01-13 NOTE — ED Notes (Signed)
Patient states he had some chest pain on left side that radiated around chest to back. Patient states his arms went numb and became dizzy. Patient states he had some pounding in head. Patient states he has history of stroke at 38 yo.

## 2019-01-13 NOTE — Discharge Instructions (Signed)

## 2019-01-13 NOTE — ED Provider Notes (Signed)
[facility] Emergency Department Provider Note  ____________________________________________   First MD Initiated Contact with Patient 01/13/19 [phone]     (approximate)  I have reviewed the triage vital signs and the nursing notes.   HISTORY  Chief Complaint Dizziness    HPI Kyle Simmons. is a 38 y.o. male with medical history as listed below which notably includes heart valve replacement at age 70 on chronic warfarin and he reports that he had a stroke thought to be from a clot from the heart valve which developed into a brain hemorrhage at age 59.  He is still on warfarin and reports his medication was recently adjusted.  He presents tonight by private vehicle for evaluation of a throbbing headache that gradually got worse over the course of the afternoon and evening tonight.  A few hours ago he suddenly developed acute onset bilateral hand numbness and tingling similar to what one would feel if there arms "fell asleep".  The headache became acutely worse at that time and he seemed to feel it more on the left side but had symptoms on both sides.  Nothing in particular made it better or worse but the symptoms gradually got better over time and he is currently asymptomatic except for a mild persistent throbbing headache.  No visual changes, no auras.  No seizure-like activity.  He has not been in contact with COVID-19 patients.  He denies sore throat, cough, shortness of breath, chest pain, nausea, vomiting, abdominal pain, leg numbness, nor leg weakness.  He had no weakness in his arms when the episode happened.  The symptoms were severe.         Past Medical History:  Diagnosis Date  . 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 CVA (cerebral vascular accident) [facility])    Patient reports "throwing a clot" to his brain and having a  stroke at age 51 due to heart valve replacement  . Seizures ([place])   . Stroke ([place])   . Testicular abscess     There are no active problems to display for this patient.   Past Surgical History:  Procedure Laterality Date  . AORTIC VALVE REPLACEMENT      Prior to Admission medications   Medication Sig Start Date End Date Taking? Authorizing Provider  oxyCODONE-acetaminophen (ROXICET) 5-325 MG per tablet Take 1 tablet by mouth every 6 (six) hours as needed for severe pain. 11/18/14   Carrie Mew, MD  predniSONE (DELTASONE) 20 MG tablet Take 2 tablets (40 mg total) by mouth daily. 11/18/14   Carrie Mew, MD  SUMAtriptan [facility]) 20 MG/ACT nasal spray Place 1 spray (20 mg total) into the nose as needed for migraine or headache. May repeat in 2 hours if headache persists or recurs. 01/13/19 01/13/20  Hinda Kehr, MD    Allergies Cortisone, Sudafed [pseudoephedrine hcl], and Tramadol  History reviewed. No pertinent family history.  Social History Social History   Tobacco Use  . Smoking status: Never Smoker  . Smokeless tobacco: Never Used  Substance Use Topics  . Alcohol use: No  . Drug use: Not on file    Review of Systems Constitutional: No fever/chills Eyes: No visual changes. ENT: No sore throat. Cardiovascular: Denies chest pain. Respiratory: Denies shortness of breath. Gastrointestinal: No abdominal pain.  No nausea, no vomiting.  No diarrhea.  No constipation. Genitourinary: Negative  for dysuria. Musculoskeletal: Negative for neck pain.  Negative for back pain. Integumentary: Negative for rash. Neurological: Headache for hours as described above.  Brief episode of bilateral hand numbness and tingling, now resolved.   ____________________________________________   PHYSICAL EXAM:  VITAL SIGNS: ED Triage Vitals  Enc Vitals Group     BP 01/12/19 2317 (!) 146/74     Pulse Rate 01/12/19 2317 74     Resp 01/12/19 2317 16     Temp 01/12/19 2317 98.4 F  (36.9 C)     Temp Source 01/12/19 2317 Oral     SpO2 01/12/19 2317 100 %     Weight 01/12/19 2313 72.6 kg (160 lb)     Height 01/12/19 2313 1.651 m (5\' 5" )     Head Circumference --      Peak Flow --      Pain Score 01/12/19 2313 0     Pain Loc --      Pain Edu? --      Excl. in GC? --     Constitutional: Alert and oriented.  No acute distress. Eyes: Conjunctivae are normal.  Pupils are equal and reactive bilaterally.  No nystagmus. Head: Atraumatic. Nose: No congestion/rhinnorhea. Mouth/Throat: Mucous membranes are moist. Neck: No stridor.  No meningeal signs.   Cardiovascular: Normal rate, regular rhythm. Good peripheral circulation. Grossly normal heart sounds. Respiratory: Normal respiratory effort.  No retractions. Gastrointestinal: Soft and nontender. No distention.  Musculoskeletal: No lower extremity tenderness nor edema. No gross deformities of extremities. Neurologic:  Normal speech and language. No gross focal neurologic deficits are appreciated.  He has good grip strength bilaterally, normal muscle strength in the major muscle groups of his arms and his legs, and no evidence of any gross cranial nerve deficits. Skin:  Skin is warm, dry and intact. Psychiatric: Mood and affect are normal. Speech and behavior are normal.  ____________________________________________   LABS (all labs ordered are listed, but only abnormal results are displayed)  Labs Reviewed  COMPREHENSIVE METABOLIC PANEL - Abnormal; Notable for the following components:      Result Value   Glucose, Bld 114 (*)    Creatinine, Ser 1.33 (*)    Albumin 5.2 (*)    All other components within normal limits  PROTIME-INR - Abnormal; Notable for the following components:   Prothrombin Time 25.3 (*)    INR 2.3 (*)    All other components within normal limits  CBC WITH DIFFERENTIAL/PLATELET  TROPONIN I (HIGH SENSITIVITY)  TROPONIN I (HIGH SENSITIVITY)   ____________________________________________   EKG  ED ECG REPORT I, Loleta Roseory Raeli Wiens, the attending physician, personally viewed and interpreted this ECG.  Date: 01/12/2019 EKG Time: 23: 30 Rate: 65 Rhythm: normal sinus rhythm QRS Axis: normal Intervals: normal ST/T Wave abnormalities: normal Narrative Interpretation: no evidence of acute ischemia  ____________________________________________  RADIOLOGY I, Loleta Roseory Kahleel Fadeley, personally viewed and evaluated these images (plain radiographs) as part of my medical decision making, as well as reviewing the written report by the radiologist.  ED MD interpretation:  No acute abnormalities on brain MRI - evidence of old injuries.  Official radiology report(s): Mr Brain Wo Contrast  Result Date: 01/13/2019 CLINICAL DATA:  Focal neuro deficit, > 6 hrs, stroke suspected acute onset bilateral hand numbness associated with headache. history of prior brain hemorrhage about 20 years ago. Takes warfarin now for heart valve replacement. EXAM: MRI HEAD WITHOUT CONTRAST TECHNIQUE: Multiplanar, multiecho pulse sequences of the brain and surrounding structures were obtained without intravenous contrast. COMPARISON:  CT head without contrast 10/19/2012 FINDINGS: Brain: Remote encephalomalacia of the left occipital and parietal lobe stable. Focal encephalomalacia in the right frontal lobe associated with prior ventriculostomy tract is also noted. Remote lacunar infarcts are present within the cerebellum bilaterally. No acute infarct, hemorrhage, or mass lesion is present. Ex vacuo dilation is present in the posterior left lateral ventricle. Ventricles are otherwise normal in size. No significant extraaxial fluid collection is present. The internal auditory canals are within normal limits. Brainstem and cerebellum are otherwise within normal limits. Vascular: Flow is present in the major intracranial arteries. Skull and upper cervical spine: The craniocervical junction is normal. Upper cervical spine is within normal  limits. Marrow signal is unremarkable. Sinuses/Orbits: Polyps or mucous retention cysts are present in the inferior left maxillary sinus, chronic. The paranasal sinuses and mastoid air cells are otherwise clear. The globes and orbits are within normal limits. IMPRESSION: 1. No acute intracranial abnormality to explain the patient's new onset of bilateral hand numbness or headache. 2. 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 3. Remote lacunar infarcts of the cerebellum bilaterally. Electronically Signed   By: Marin Robertshristopher  Mattern M.D.   On: 01/13/2019 05:36    ____________________________________________   PROCEDURES   Procedure(s) performed (including Critical Care):  Procedures   ____________________________________________   INITIAL IMPRESSION / MDM / ASSESSMENT AND PLAN / ED COURSE  As part of my medical decision making, I reviewed the following data within the electronic MEDICAL RECORD NUMBER Nursing notes reviewed and incorporated, Labs reviewed , Old chart reviewed, Notes from prior ED visits and [place] Controlled Substance Database   Differential diagnosis includes, but is not limited to, complicated migraine, panic attacks or anxiety attack, electrolyte abnormalities, CVA (either ischemic or hemorrhagic).  CBC is normal, comprehensive metabolic panel is reassuring with a slight elevation of his creatinine but normal GFR.  High-sensitivity troponin is 8.  INR is 2.3 which is subtherapeutic for his target of 2.5-3.5 but very close to the appropriate range.  He has no neurological deficits at this time with an NIH stroke scale of 0.  Not a candidate for TPA regardless given his chronic warfarin.  However I do think he would benefit from an MR brain particular given his complicated neurological and neurosurgical history.  He agrees with the plan.  We will proceed with MR brain without contrast.      Clinical Course as of Jan 12 557  Tue Jan 13, 2019  0403  Of note the initial high-sensitivity troponin was 7 on the second line was 80 and he is low risk for ACS based on HEAR score, no indication for further cardiac workup.   [CF]  0404 EKG normal and non-ischemic.   [CF]  0555 MR brain is reassuring with no evidence of an acute or emergent condition.  It did show encephalomalacia and evidence of his remote injuries as described above.The patient is asymptomatic right now except for what he describes as a 1 out of 10 headache.  I offered a migraine cocktail but he is comfortable with the plan for discharge and outpatient follow-up.  I will give him a prescription for Imitrex and he will follow-up with his regular doctor.  I gave my usual and customary return precautions.  MR BRAIN WO CONTRAST [CF]    Clinical Course User Index [CF] Loleta RoseForbach, Jamol Ginyard, MD     ____________________________________________  FINAL CLINICAL IMPRESSION(S) / ED DIAGNOSES  Final diagnoses:  Complicated migraine     MEDICATIONS  GIVEN DURING THIS VISIT:  Medications - No data to display   ED Discharge Orders         Ordered    SUMAtriptan (IMITREX) 20 MG/ACT nasal spray  As needed     01/13/19 0557          *Please note:  Bracen Sittner. was evaluated in Emergency Department on 01/13/2019 for the symptoms described in the history of present illness. He was evaluated in the context of the global COVID-19 pandemic, which necessitated consideration that the patient might be at risk for infection with the SARS-CoV-2 virus that causes COVID-19. Institutional protocols and algorithms that pertain to the evaluation of patients at risk for COVID-19 are in a state of rapid change based on information released by regulatory bodies including the CDC and federal and state organizations. These policies and algorithms were followed during the patient's care in the ED.  Some ED evaluations and interventions may be delayed as a result of limited staffing during the pandemic.*   Note:  This document was prepared using Dragon voice recognition software and may include unintentional dictation errors.   Loleta Rose, MD 01/13/19 [phone]

## 2019-10-13 ENCOUNTER — Encounter: Payer: Self-pay | Admitting: Emergency Medicine

## 2019-10-13 ENCOUNTER — Emergency Department: Payer: 59

## 2019-10-13 ENCOUNTER — Emergency Department
Admission: EM | Admit: 2019-10-13 | Discharge: 2019-10-13 | Disposition: A | Discharge status: Home / Self Care | Payer: 59 | Attending: Emergency Medicine | Admitting: Emergency Medicine

## 2019-10-13 DIAGNOSIS — Z7901 Long term (current) use of anticoagulants: Secondary | ICD-10-CM | POA: Insufficient documentation

## 2019-10-13 DIAGNOSIS — R200 Anesthesia of skin: Secondary | ICD-10-CM | POA: Diagnosis present

## 2019-10-13 DIAGNOSIS — R202 Paresthesia of skin: Secondary | ICD-10-CM

## 2019-10-13 DIAGNOSIS — Z8673 Personal history of transient ischemic attack (TIA), and cerebral infarction without residual deficits: Secondary | ICD-10-CM | POA: Diagnosis not present

## 2019-10-13 DIAGNOSIS — G43109 Migraine with aura, not intractable, without status migrainosus: Secondary | ICD-10-CM

## 2019-10-13 DIAGNOSIS — Z952 Presence of prosthetic heart valve: Secondary | ICD-10-CM | POA: Diagnosis not present

## 2019-10-13 DIAGNOSIS — G43909 Migraine, unspecified, not intractable, without status migrainosus: Secondary | ICD-10-CM | POA: Diagnosis not present

## 2019-10-13 LAB — COMPREHENSIVE METABOLIC PANEL
ALT: 17 U/L (ref 0–44)
AST: 26 U/L (ref 15–41)
Albumin: 4.8 g/dL (ref 3.5–5.0)
Alkaline Phosphatase: 67 U/L (ref 38–126)
Anion gap: 11 (ref 5–15)
BUN: 14 mg/dL (ref 6–20)
CO2: 28 mmol/L (ref 22–32)
Calcium: 8.9 mg/dL (ref 8.9–10.3)
Chloride: 101 mmol/L (ref 98–111)
Creatinine, Ser: 1.21 mg/dL (ref 0.61–1.24)
GFR calc Af Amer: >60 mL/min (ref >60)
GFR calc non Af Amer: >60 mL/min (ref >60)
Glucose, Bld: 133 mg/dL — ABNORMAL HIGH (ref 70–99)
Potassium: 4.5 mmol/L (ref 3.5–5.1)
Sodium: 140 mmol/L (ref 135–145)
Total Bilirubin: 1 mg/dL (ref 0.3–1.2)
Total Protein: 7.2 g/dL (ref 6.5–8.1)

## 2019-10-13 LAB — CBC
HCT: 43.5 % (ref 39.0–52.0)
Hemoglobin: 14.5 g/dL (ref 13.0–17.0)
MCH: 28.3 pg (ref 26.0–34.0)
MCHC: 33.3 g/dL (ref 30.0–36.0)
MCV: 85 fL (ref 80.0–100.0)
Platelets: 232 10*3/uL (ref 150–400)
RBC: 5.12 MIL/uL (ref 4.22–5.81)
RDW: 11.9 % (ref 11.5–15.5)
WBC: 5.1 10*3/uL (ref 4.0–10.5)
nRBC: 0 % (ref 0.0–0.2)

## 2019-10-13 LAB — DIFFERENTIAL
Abs Immature Granulocytes: 0.01 10*3/uL (ref 0.00–0.07)
Basophils Absolute: 0 10*3/uL (ref 0.0–0.1)
Basophils Relative: 1 %
Eosinophils Absolute: 0.1 10*3/uL (ref 0.0–0.5)
Eosinophils Relative: 2 %
Immature Granulocytes: 0 %
Lymphocytes Relative: 27 %
Lymphs Abs: 1.4 10*3/uL (ref 0.7–4.0)
Monocytes Absolute: 0.4 10*3/uL (ref 0.1–1.0)
Monocytes Relative: 7 %
Neutro Abs: 3.2 10*3/uL (ref 1.7–7.7)
Neutrophils Relative %: 63 %

## 2019-10-13 LAB — APTT: aPTT: 45 s — ABNORMAL HIGH (ref 24–36)

## 2019-10-13 LAB — PROTIME-INR
INR: 2.5 — ABNORMAL HIGH (ref 0.8–1.2)
Prothrombin Time: 26.3 s — ABNORMAL HIGH (ref 11.4–15.2)

## 2019-10-13 MED ORDER — DIPHENHYDRAMINE HCL 50 MG/ML IJ SOLN
12.5000 mg | INTRAMUSCULAR | Status: AC
  Administered 2019-10-13: 12.5 mg via INTRAVENOUS
  Filled 2019-10-13: qty 1

## 2019-10-13 MED ORDER — PROCHLORPERAZINE EDISYLATE 10 MG/2ML IJ SOLN
10.0000 mg | INTRAMUSCULAR | Status: AC
  Administered 2019-10-13: 10 mg via INTRAVENOUS
  Filled 2019-10-13: qty 2

## 2019-10-13 MED ORDER — DEXAMETHASONE SODIUM PHOSPHATE 10 MG/ML IJ SOLN
10.0000 mg | Freq: Once | INTRAMUSCULAR | Status: AC
  Administered 2019-10-13: 10 mg via INTRAVENOUS
  Filled 2019-10-13: qty 1

## 2019-10-13 MED ORDER — SODIUM CHLORIDE 0.9 % IV BOLUS
1000.0000 mL | Freq: Once | INTRAVENOUS | Status: AC
  Administered 2019-10-13: 1000 mL via INTRAVENOUS

## 2019-10-13 NOTE — ED Notes (Signed)
Post discharge pt approached front desk in [place] to inform this RN that he wants to now take the medication that the MD was talking about before discharge. MD made aware and pt placed back onto board for further treatment.

## 2019-10-13 NOTE — ED Provider Notes (Addendum)
[facility] Emergency Department Provider Note  ____________________________________________   First MD Initiated Contact with Patient 10/13/19 [phone]     (approximate)  I have reviewed the triage vital signs and the nursing notes.   HISTORY  Chief Complaint Numbness    HPI Kyle Simmons. is a 39 y.o. 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  He is on lifelong warfarin therapy.  He is also had complicated migraines in the past.  He presents tonight for evaluation of acute onset numbness and tingling to the right side of his body that started about 5-1/2 hours prior to my assessment of the patient.  He said that once he started feeling an absence of sensation particularly in his right hand and arm, it made him very scared because his mind immediately went to whether or not he was having another stroke.  He has been compliant with his warfarin and has not missed any doses but his medication has been changed around a little bit recently.  He is also had a headache which is not uncommon for him which light makes worse and nothing in particular makes it better.  Some mild nausea, no vomiting.  No weakness in any of his extremities.  No difficulty with ambulation or balance but he did feel a little bit dizzy earlier.  No recent trauma.  Overall he described the symptoms as severe earlier but he notes that the numbness and tingling has completely resolved and he feels normal now except for the persistent mild headache with photophobia.  He denies fever, sore throat, chest pain, shortness of breath, and dysuria.         Past Medical History:  Diagnosis Date  . 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 CVA  (cerebral vascular accident) [facility])    Patient reports "throwing a clot" to his brain and having a stroke at age 59 due to heart valve replacement  . Seizures (HCC)   . Stroke (HCC)   . Testicular abscess     There are no problems to display for this patient.   Past Surgical History:  Procedure Laterality Date  . AORTIC VALVE REPLACEMENT      Prior to Admission medications   Medication Sig Start Date End Date Taking? Authorizing Provider  oxyCODONE-acetaminophen (ROXICET) 5-325 MG per tablet Take 1 tablet by mouth every 6 (six) hours as needed for severe pain. 11/18/14   Sharman Cheek, MD  predniSONE (DELTASONE) 20 MG tablet Take 2 tablets (40 mg total) by mouth daily. 11/18/14   Sharman Cheek, MD  SUMAtriptan [facility]) 20 MG/ACT nasal spray Place 1 spray (20 mg total) into the nose as needed for migraine or headache. May repeat in 2 hours if headache persists or recurs. 01/13/19 01/13/20  Loleta Rose, MD    Allergies Cortisone, Sudafed [pseudoephedrine hcl], and Tramadol  History reviewed. No pertinent family history.  Social History Social History   Tobacco Use  . Smoking status: Never Smoker  . Smokeless tobacco: Never Used  Substance Use Topics  . Alcohol use: No  . Drug use: Not on file    Review of Systems Constitutional: No fever/chills Eyes: No visual changes. + Photophobia ENT: No sore throat. Cardiovascular: Denies chest  pain. Respiratory: Denies shortness of breath. Gastrointestinal: No abdominal pain.  Some nausea associated with headache.  No diarrhea.  No constipation. Genitourinary: Negative for dysuria. Musculoskeletal: Negative for neck pain.  Negative for back pain. Integumentary: Negative for rash. Neurological: Acute onset right-sided numbness in his arm and his leg, less so in the face.  No focal weakness.  Some mild dizziness, no difficulty with ambulation or balance.   ____________________________________________   PHYSICAL EXAM:  VITAL  SIGNS: ED Triage Vitals [10/13/19 0017]  Enc Vitals Group     BP (!) 174/87     Pulse Rate 88     Resp 20     Temp 99.3 F (37.4 C)     Temp Source Oral     SpO2 99 %     Weight      Height      Head Circumference      Peak Flow      Pain Score      Pain Loc      Pain Edu?      Excl. in [place]?     Constitutional: Alert and oriented.  Eyes: Conjunctivae are normal.  Pupils are equal and reactive bilaterally.  Extraocular movement is intact. Head: Atraumatic. Nose: No congestion/rhinnorhea. Mouth/Throat: Patient is wearing a mask. Neck: No stridor.  No meningeal signs.   Cardiovascular: Normal rate, regular rhythm. Good peripheral circulation. Grossly normal heart sounds. Respiratory: Normal respiratory effort.  No retractions. Gastrointestinal: Soft and nontender. No distention.  Musculoskeletal: No lower extremity tenderness nor edema. No gross deformities of extremities. Neurologic:  Normal speech and language. No gross focal neurologic deficits are appreciated.  He has good grip strength throughout his extremities and no evidence of cerebellar deficits.  No subjective sensory deficits at this time.  NIH stroke scale of 0. Skin:  Skin is warm, dry and intact. Psychiatric: Mood and affect are normal. Speech and behavior are normal.  ____________________________________________   LABS (all labs ordered are listed, but only abnormal results are displayed)  Labs Reviewed  PROTIME-INR - Abnormal; Notable for the following components:      Result Value   Prothrombin Time 26.3 (*)    INR 2.5 (*)    All other components within normal limits  APTT - Abnormal; Notable for the following components:   aPTT 45 (*)    All other components within normal limits  COMPREHENSIVE METABOLIC PANEL - Abnormal; Notable for the following components:   Glucose, Bld 133 (*)    All other components within normal limits  CBC  DIFFERENTIAL  CBG MONITORING, ED  I-STAT CREATININE, ED    ____________________________________________  EKG  ED ECG REPORT I, Hinda Kehr, the attending physician, personally viewed and interpreted this ECG.  Date: 10/13/2019 EKG Time: 00: 14 Rate: 84 Rhythm: normal sinus rhythm QRS Axis: normal Intervals: normal ST/T Wave abnormalities: normal Narrative Interpretation: no evidence of acute ischemia  ____________________________________________  RADIOLOGY I, Hinda Kehr, personally viewed and evaluated these images (plain radiographs) as part of my medical decision making, as well as reviewing the written report by the radiologist.  ED MD interpretation: No acute abnormalities identified on head CT with evidence of chronic left occipital and parietal infarction.  MRI brain and MRA head demonstrate no acute abnormalities.  Official radiology report(s): CT HEAD WO CONTRAST  Result Date: 10/13/2019 CLINICAL DATA:  Increased numbness on the right EXAM: CT HEAD WITHOUT CONTRAST TECHNIQUE: Contiguous axial images were obtained from the base of the skull through the  vertex without intravenous contrast. COMPARISON:  MRI from 01/13/2019 FINDINGS: Brain: Prior left occipital and parietal lobe infarct is seen and stable. No acute hemorrhage, acute infarction or space-occupying mass lesion is noted. Chronic calcification is noted in the right frontal stable from prior CT from 2014. Vascular: No hyperdense vessel or unexpected calcification. Skull: Normal. Negative for fracture or focal lesion. Postsurgical changes are noted left posteriorly. Sinuses/Orbits: No acute finding. Other: None. IMPRESSION: Chronic left occipital and parietal infarct. No acute abnormality noted. Electronically Signed   By: Alcide Clever M.D.   On: 10/13/2019 00:51   MR ANGIO HEAD WO CONTRAST  Result Date: 10/13/2019 CLINICAL DATA:  Numbness and tingling on the right side of body. EXAM: MRI HEAD WITHOUT CONTRAST MRA HEAD WITHOUT CONTRAST TECHNIQUE: Multiplanar, multiecho pulse  sequences of the brain and surrounding structures were obtained without intravenous contrast. Angiographic images of the head were obtained using MRA technique without contrast. COMPARISON:  01/13/2019 brain MRI FINDINGS: MRI HEAD FINDINGS Brain: No acute infarction, hemorrhage, hydrocephalus, extra-axial collection or mass lesion. Large remote left occipital infarct in the PCA distribution. Small remote bilateral cerebellar infarcts. Linear gliosis in the right frontal lobe extending to the lateral ventricle, likely old ventriculostomy site. Vascular: Normal flow voids. Skull and upper cervical spine: Normal marrow signal Sinuses/Orbits: Retention cysts in the inferior left maxillary sinus. 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 No branch occlusion, beading, flow limiting stenosis, or aneurysm. IMPRESSION: Brain MRI: 1. No acute finding, including infarct. 2. Remote left occipital and bilateral cerebellar infarcts. Intracranial MRA: Negative. Electronically Signed   By: Marnee Spring M.D.   On: 10/13/2019 05:52   MR BRAIN WO CONTRAST  Result Date: 10/13/2019 CLINICAL DATA:  Numbness and tingling on the right side of body. EXAM: MRI HEAD WITHOUT CONTRAST MRA HEAD WITHOUT CONTRAST TECHNIQUE: Multiplanar, multiecho pulse sequences of the brain and surrounding structures were obtained without intravenous contrast. Angiographic images of the head were obtained using MRA technique without contrast. COMPARISON:  01/13/2019 brain MRI FINDINGS: MRI HEAD FINDINGS Brain: No acute infarction, hemorrhage, hydrocephalus, extra-axial collection or mass lesion. Large remote left occipital infarct in the PCA distribution. Small remote bilateral cerebellar infarcts. Linear gliosis in the right frontal lobe extending to the lateral ventricle, likely old ventriculostomy site. Vascular: Normal flow voids. Skull and upper cervical spine: Normal marrow signal  Sinuses/Orbits: Retention cysts in the inferior left maxillary sinus. 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 No branch occlusion, beading, flow limiting stenosis, or aneurysm. IMPRESSION: Brain MRI: 1. No acute finding, including infarct. 2. Remote left occipital and bilateral cerebellar infarcts. Intracranial MRA: Negative. Electronically Signed   By: Marnee Spring M.D.   On: 10/13/2019 05:52    ____________________________________________   PROCEDURES   Procedure(s) performed (including Critical Care):  Procedures   ____________________________________________   INITIAL IMPRESSION / MDM / ASSESSMENT AND PLAN / ED COURSE  As part of my medical decision making, I reviewed the following data within the electronic MEDICAL RECORD NUMBER Nursing notes reviewed and incorporated, Labs reviewed , Old chart reviewed and Notes from prior ED visits, and discussed case with and transferred care to Dr. Darnelle Catalan.   Differential diagnosis includes, but is not limited to, complicated migraine, CVA/TIA, intracranial bleeding, anxiety/panic attack, electrolyte or metabolic abnormality.  The patient's symptoms were acute in onset about  5-1/2 hours ago but have completely resolved.  TIA is possible with the patient is compliant with his warfarin with an INR of 2.5.  Ischemic stroke is unlikely but given his history I think it is reasonable to further evaluate with MR brain and MRA head.  His CT scan showed no evidence of acute hemorrhage.  Lab work is all reassuring with no leukocytosis and a normal comprehensive metabolic panel.  His NIH stroke scale is 0 and regardless he is not a candidate for TPA given his chronic warfarin.  I offered a migraine cocktail but his symptoms are mild and we both agreed that we would proceed with the MRIs and then reassess.       Clinical Course as of Oct 12 712  Tue Oct 13, 2019  0557 Normal  MRI/MRA.  Will update patient and I anticipate discharge.   [CF]  0601 Patient understands the results and will follow up as an outpatient.  Gave usual/customary return precautions.   [CF]  [phone] I was told that before the patient left the emergency department, he went back to the desk and told the nurse that he had changed his mind and that he really needed the medications for his migraine.  We discussed a "migraine cocktail" earlier tonight but he had declined at multiple times.Since he has changed his mind, he will be put back and his previous bed.  I ordered prochlorperazine 10 mg IV, diphenhydramine 12.5 mg IV, dexamethasone 10 mg IV, and a liter of IV fluids.  I will have to transfer ED care to Dr. Darnelle Catalan to follow-up and reassess.   [CF]  [phone] Patient was placed in [facility] instead of [facility], so Dr. Scotty Court is taking over care instead of Dr. Darnelle Catalan.   [CF]    Clinical Course User Index [CF] Loleta Rose, MD     ____________________________________________  FINAL CLINICAL IMPRESSION(S) / ED DIAGNOSES  Final diagnoses:  Complicated migraine  Paresthesia     MEDICATIONS GIVEN DURING THIS VISIT:  Medications  prochlorperazine (COMPAZINE) injection 10 mg (has no administration in time range)  diphenhydrAMINE (BENADRYL) injection 12.5 mg (has no administration in time range)  dexamethasone (DECADRON) injection 10 mg (has no administration in time range)  sodium chloride 0.9 % bolus 1,000 mL (has no administration in time range)     ED Discharge Orders    None      *Please note:  Chazz Philson. was evaluated in Emergency Department on 10/13/2019 for the symptoms described in the history of present illness. He was evaluated in the context of the global COVID-19 pandemic, which necessitated consideration that the patient might be at risk for infection with the SARS-CoV-2 virus that causes COVID-19. Institutional protocols and algorithms that pertain to the evaluation of patients at  risk for COVID-19 are in a state of rapid change based on information released by regulatory bodies including the CDC and federal and state organizations. These policies and algorithms were followed during the patient's care in the ED.  Some ED evaluations and interventions may be delayed as a result of limited staffing during the pandemic.*  Note:  This document was prepared using Dragon voice recognition software and may include unintentional dictation errors.   Loleta Rose, MD 10/13/19 3500    Loleta Rose, MD 10/13/19 [phone]    Loleta Rose, MD 10/13/19 [phone]

## 2019-10-13 NOTE — ED Triage Notes (Signed)
Pt reports he started having numbness and tingling to the right side of body at approx. 2230. No drift or grip issue present. Pt does have sensation differences to the right leg and right arm. Pt has hx/o CVA and is currently on Warfarin.

## 2019-10-13 NOTE — ED Provider Notes (Signed)
Procedures  Clinical Course as of Oct 12 800  Tue Oct 13, 2019  0557 Normal MRI/MRA.  Will update patient and I anticipate discharge.   [CF]  0601 Patient understands the results and will follow up as an outpatient.  Gave usual/customary return precautions.   [CF]  [phone] I was told that before the patient left the emergency department, he went back to the desk and told the nurse that he had changed his mind and that he really needed the medications for his migraine.  We discussed a "migraine cocktail" earlier tonight but he had declined at multiple times.Since he has changed his mind, he will be put back and his previous bed.  I ordered prochlorperazine 10 mg IV, diphenhydramine 12.5 mg IV, dexamethasone 10 mg IV, and a liter of IV fluids.  I will have to transfer ED care to Dr. Darnelle Catalan to follow-up and reassess.   [CF]  [phone] Patient was placed in [facility] instead of [facility], so Dr. Scotty Court is taking over care instead of Dr. Darnelle Catalan.   [CF]    Clinical Course User Index [CF] Loleta Rose, MD    ----------------------------------------- 8:02 AM on 10/13/2019 ----------------------------------------- 40 minutes after receiving migraine cocktail, patient reports that he is feeling better, paresthesias have resolved, photophobia has resolved.  Not significantly sedated or confused from medications, stable for discharge.    Sharman Cheek, MD 10/13/19 [phone]

## 2019-10-13 NOTE — Discharge Instructions (Addendum)

## 2020-01-17 ENCOUNTER — Emergency Department: Payer: 59

## 2020-01-17 ENCOUNTER — Emergency Department
Admission: EM | Admit: 2020-01-17 | Discharge: 2020-01-17 | Disposition: A | Discharge status: Home / Self Care | Payer: 59 | Attending: Student in an Organized Health Care Education/Training Program | Admitting: Student in an Organized Health Care Education/Training Program

## 2020-01-17 ENCOUNTER — Other Ambulatory Visit: Payer: Self-pay

## 2020-01-17 ENCOUNTER — Emergency Department
Admission: EM | Admit: 2020-01-17 | Discharge: 2020-01-17 | Disposition: A | Discharge status: Other Acute Inpatient Hospital | Payer: 59

## 2020-01-17 DIAGNOSIS — S7012XA Contusion of left thigh, initial encounter: Secondary | ICD-10-CM | POA: Insufficient documentation

## 2020-01-17 DIAGNOSIS — Y929 Unspecified place or not applicable: Secondary | ICD-10-CM | POA: Diagnosis not present

## 2020-01-17 DIAGNOSIS — W1840XA Slipping, tripping and stumbling without falling, unspecified, initial encounter: Secondary | ICD-10-CM | POA: Diagnosis not present

## 2020-01-17 DIAGNOSIS — Y9389 Activity, other specified: Secondary | ICD-10-CM | POA: Diagnosis not present

## 2020-01-17 DIAGNOSIS — Z8673 Personal history of transient ischemic attack (TIA), and cerebral infarction without residual deficits: Secondary | ICD-10-CM | POA: Diagnosis not present

## 2020-01-17 DIAGNOSIS — R52 Pain, unspecified: Secondary | ICD-10-CM

## 2020-01-17 DIAGNOSIS — I619 Nontraumatic intracerebral hemorrhage, unspecified: Secondary | ICD-10-CM | POA: Insufficient documentation

## 2020-01-17 DIAGNOSIS — Z79899 Other long term (current) drug therapy: Secondary | ICD-10-CM | POA: Insufficient documentation

## 2020-01-17 DIAGNOSIS — Y999 Unspecified external cause status: Secondary | ICD-10-CM | POA: Insufficient documentation

## 2020-01-17 DIAGNOSIS — T148XXA Other injury of unspecified body region, initial encounter: Secondary | ICD-10-CM

## 2020-01-17 LAB — COMPREHENSIVE METABOLIC PANEL
ALT: 20 U/L (ref 0–44)
AST: 26 U/L (ref 15–41)
Albumin: 5.3 g/dL — ABNORMAL HIGH (ref 3.5–5.0)
Alkaline Phosphatase: 70 U/L (ref 38–126)
Anion gap: 12 (ref 5–15)
BUN: 14 mg/dL (ref 6–20)
CO2: 28 mmol/L (ref 22–32)
Calcium: 10 mg/dL (ref 8.9–10.3)
Chloride: 100 mmol/L (ref 98–111)
Creatinine, Ser: 1 mg/dL (ref 0.61–1.24)
GFR calc Af Amer: >60 mL/min (ref >60)
GFR calc non Af Amer: >60 mL/min (ref >60)
Glucose, Bld: 115 mg/dL — ABNORMAL HIGH (ref 70–99)
Potassium: 3.9 mmol/L (ref 3.5–5.1)
Sodium: 140 mmol/L (ref 135–145)
Total Bilirubin: 1.5 mg/dL — ABNORMAL HIGH (ref 0.3–1.2)
Total Protein: 8.7 g/dL — ABNORMAL HIGH (ref 6.5–8.1)

## 2020-01-17 LAB — CBC WITH DIFFERENTIAL/PLATELET
Abs Immature Granulocytes: 0.04 10*3/uL (ref 0.00–0.07)
Basophils Absolute: 0 10*3/uL (ref 0.0–0.1)
Basophils Relative: 0 %
Eosinophils Absolute: 0 10*3/uL (ref 0.0–0.5)
Eosinophils Relative: 0 %
HCT: 42.9 % (ref 39.0–52.0)
Hemoglobin: 14.2 g/dL (ref 13.0–17.0)
Immature Granulocytes: 0 %
Lymphocytes Relative: 7 %
Lymphs Abs: 0.8 10*3/uL (ref 0.7–4.0)
MCH: 28.1 pg (ref 26.0–34.0)
MCHC: 33.1 g/dL (ref 30.0–36.0)
MCV: 85 fL (ref 80.0–100.0)
Monocytes Absolute: 0.9 10*3/uL (ref 0.1–1.0)
Monocytes Relative: 8 %
Neutro Abs: 9.6 10*3/uL — ABNORMAL HIGH (ref 1.7–7.7)
Neutrophils Relative %: 85 %
Platelets: 282 10*3/uL (ref 150–400)
RBC: 5.05 MIL/uL (ref 4.22–5.81)
RDW: 12 % (ref 11.5–15.5)
WBC: 11.3 10*3/uL — ABNORMAL HIGH (ref 4.0–10.5)
nRBC: 0 % (ref 0.0–0.2)

## 2020-01-17 LAB — APTT: aPTT: 107 seconds — ABNORMAL HIGH (ref 24–36)

## 2020-01-17 LAB — PROTIME-INR
INR: 3.4 — ABNORMAL HIGH (ref 0.8–1.2)
Prothrombin Time: 33 seconds — ABNORMAL HIGH (ref 11.4–15.2)

## 2020-01-17 MED ORDER — ONDANSETRON 4 MG PO TBDP: 4.0000 mg | ORAL_TABLET | Freq: Three times a day (TID) | ORAL | 0 refills | Status: DC | PRN

## 2020-01-17 MED ORDER — HYDROCODONE-ACETAMINOPHEN 5-325 MG PO TABS
1.0000 | ORAL_TABLET | Freq: Once | ORAL | Status: AC
  Administered 2020-01-17: 1 via ORAL
  Filled 2020-01-17: qty 1

## 2020-01-17 MED ORDER — ONDANSETRON 4 MG PO TBDP: 4.0000 mg | ORAL_TABLET | Freq: Three times a day (TID) | ORAL | 0 refills | Status: AC | PRN

## 2020-01-17 MED ORDER — ONDANSETRON 4 MG PO TBDP
4.0000 mg | ORAL_TABLET | Freq: Once | ORAL | Status: AC
  Administered 2020-01-17: 4 mg via ORAL
  Filled 2020-01-17: qty 1

## 2020-01-17 MED ORDER — HYDROCODONE-ACETAMINOPHEN 5-325 MG PO TABS
1.0000 | ORAL_TABLET | Freq: Four times a day (QID) | ORAL | 0 refills | Status: AC | PRN
Start: 2020-01-17 — End: 2020-01-19

## 2020-01-17 NOTE — ED Provider Notes (Signed)
Emergency Department Provider Note  ____________________________________________  Time seen: Approximately 6:29 PM  I have reviewed the triage vital signs and the nursing notes.   HISTORY  Chief Complaint Leg Pain   Historian Patient     HPI Kyle Simmons. is a 39 y.o. male with a history of CVA on Coumadin, presents to the emergency department with 10 out of 10 right thigh pain.  Patient states that he injured himself while playing on a slip and slide with his children.  He states that since injury occurred, he has noticed that his right thigh is been progressively swelling and he has had difficulty ambulating.  He states that he has had to use a cane which is atypical for him.  He denies numbness or tingling in the lower extremities.  No other alleviating measures have been attempted.   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     Past Medical History:  Diagnosis Date   Cerebral brain hemorrhage (HCC)    When patient was 19, he reports he had a brain hemorrhage following a thrombotic ischemic stroke from his heart valve replacement   CVA (cerebral vascular accident) [facility])    Patient reports "throwing a clot" to his brain and having a stroke at age 39 due to heart valve replacement   Seizures (HCC)    Stroke [facility])    Testicular abscess     Patient Active Problem List   Diagnosis Date Noted   Cerebral brain hemorrhage [facility])     Past Surgical History:  Procedure Laterality Date   AORTIC VALVE REPLACEMENT      Prior to Admission medications    Medication Sig Start Date End Date Taking? Authorizing Provider  HYDROcodone-acetaminophen (NORCO) 5-325 MG tablet Take 1 tablet by mouth every 6 (six) hours as needed for up to 2 days for moderate pain. 01/17/20 01/19/20  Orvil Feil, PA-C  ondansetron (ZOFRAN ODT) 4 MG disintegrating tablet Take 1 tablet (4 mg total) by mouth every 8 (eight) hours as needed for up to 5 days. 01/17/20 01/22/20  Orvil Feil, PA-C  oxyCODONE-acetaminophen (ROXICET) 5-325 MG per tablet Take 1 tablet by mouth every 6 (six) hours as needed for severe pain. 11/18/14   Sharman Cheek, MD  predniSONE (DELTASONE) 20 MG tablet Take 2 tablets (40 mg total) by mouth daily. 11/18/14   Sharman Cheek, MD  SUMAtriptan [facility]) 20 MG/ACT nasal spray Place 1 spray (20 mg total) into the nose as needed for migraine or headache. May repeat in 2 hours if headache persists or recurs. 01/13/19 01/13/20  Loleta Rose, MD    Allergies Cortisone, Sudafed [pseudoephedrine hcl], and Tramadol  No family history on file.  Social History Social History   Tobacco Use   Smoking status: Never Smoker   Smokeless tobacco: Never Used  Substance Use Topics   Alcohol use: No   Drug use: Not Currently     Review of Systems  Constitutional: No fever/chills Eyes:  No discharge ENT: No upper  respiratory complaints. Respiratory: no cough. No SOB/ use of accessory muscles to breath Gastrointestinal:   No nausea, no vomiting.  No diarrhea.  No constipation. Musculoskeletal: Patient has right thigh pain.  Skin: Negative for rash, abrasions, lacerations, ecchymosis.    ____________________________________________   PHYSICAL EXAM:  VITAL SIGNS: ED Triage Vitals  Enc Vitals Group     BP 01/17/20 1645 (!) 138/99     Pulse Rate 01/17/20 1645 93     Resp 01/17/20 1645 17     Temp 01/17/20 1645 98.7 F (37.1 C)     Temp Source 01/17/20 1645 Oral     SpO2 01/17/20 1645 97 %     Weight 01/17/20 1647 165 lb (74.8 kg)     Height  01/17/20 1647 5\' 5"  (1.651 m)     Head Circumference --      Peak Flow --      Pain Score 01/17/20 1647 5     Pain Loc --      Pain Edu? --      Excl. in GC? --      Constitutional: Alert and oriented. Well appearing and in no acute distress. Eyes: Conjunctivae are normal. PERRL. EOMI. Head: Atraumatic. Cardiovascular: Normal rate, regular rhythm. Normal S1 and S2.  Good peripheral circulation. Respiratory: Normal respiratory effort without tachypnea or retractions. Lungs CTAB. Good air entry to the bases with no decreased or absent breath sounds Gastrointestinal: Bowel sounds x 4 quadrants. Soft and nontender to palpation. No guarding or rigidity. No distention. Musculoskeletal: Full range of motion to all extremities.  Patient demonstrates symmetric strength of the lower extremities.  Compartments along the right thigh are soft to palpation.  Patient's right thigh appears 1-1/2 times the size of the left.  No ecchymosis visualized.  He does have scattered petechiae along the lower extremities.  Palpable dorsalis pedis pulse bilaterally and symmetrically. Neurologic:  Normal for age. No gross focal neurologic deficits are appreciated.  Skin:  Skin is warm, dry and intact. No rash noted. Psychiatric: Mood and affect are normal for age. Speech and behavior are normal.   ____________________________________________   LABS (all labs ordered are listed, but only abnormal results are displayed)  Labs Reviewed  CBC WITH DIFFERENTIAL/PLATELET - Abnormal; Notable for the following components:      Result Value   WBC 11.3 (*)    Neutro Abs 9.6 (*)    All other components within normal limits  COMPREHENSIVE METABOLIC PANEL - Abnormal; Notable for the following components:   Glucose, Bld 115 (*)    Total Protein 8.7 (*)    Albumin 5.3 (*)    Total Bilirubin 1.5 (*)    All other components within normal limits  PROTIME-INR - Abnormal; Notable for the following components:   Prothrombin  Time 33.0 (*)    INR 3.4 (*)    All other components within normal limits  APTT - Abnormal; Notable for the following components:   aPTT 107 (*)    All other components within normal limits   ____________________________________________  EKG   ____________________________________________  RADIOLOGY 03/18/20, personally viewed and evaluated these images (plain radiographs) as part of my medical decision making, as well as reviewing the written report by the radiologist.  MR [facility] RIGHT WO CONTRAST  Result Date: 01/17/2020 CLINICAL DATA:  Injury on slip and slide, right leg pain. EXAM: MRI OF THE RIGHT FEMUR WITHOUT CONTRAST TECHNIQUE: Multiplanar, multisequence MR imaging of the right femur was performed. No intravenous  contrast was administered. COMPARISON:  Femur radiographs from 01/17/2020 FINDINGS: Bones/Joint/Cartilage Large knee effusion extending up in the suprapatellar bursa. Ligaments N/A Muscles and Tendons At the level of the mid thigh, adjacent blood filled spaces in the vastus intermedius muscle anteriorly in laterally with fluid-fluid levels are noted, measuring 7.0 by 5.6 by 4.3 cm (volume = 88 cm^3), favoring intramuscular hematoma. Extensive edema signal throughout the vastus intermedius muscle compatible with muscle tear/strain. Edema favoring muscle strains or mild tears noted in the sartorius, vastus medialis, distal vastus lateralis, short head of the biceps femoris, and popliteus muscles. Edema surrounds the distal iliopsoas muscle which may be strained as well. Edema tracks around the distal tendon of the gluteus minimus and also deep to the tensor fascia lata muscle. Soft tissues Lateral to the proximal iliotibial band in the subcutaneous tissues there is a 1.8 cm in long axis hematoma, this does not appear to reach the superficial fascia margin to suggest a Morel Lavallee lesion. Subcutaneous edema tracks along the superficial fascia margins of the anterior  compartment of the thigh, down along the medial knee, and along the medial, anterior, and posterior proximal calf superficial fascia margins. IMPRESSION: 1. Extensive edema signal throughout the vastus intermedius muscle compatible with muscle tear/strain, with an 88 cubic cm intramuscular hematoma. 2. Muscle strains or mild tears noted in the sartorius, vastus medialis, distal vastus lateralis, short head of the biceps femoris, and popliteus muscles. 3. Large knee effusion extending up in the suprapatellar bursa. 4. 1.8 cm subcutaneous hematoma lateral to the proximal iliotibial band, this does not appear to reach the superficial fascia margin to suggest a Morel Lavallee lesion. 5. Subcutaneous edema tracks along the superficial fascia margins of the anterior compartment of the thigh and down along the medial, anterior, and posterior proximal calf superficial fascia margins. Electronically Signed   By: Gaylyn RongWalter  Liebkemann M.D.   On: 01/17/2020 19:39   US Venous Img Lower Unilateral Right  Result Date: 01/17/2020 CLINICAL DATA:  Increasing right lower extremity pain, edema, and ecchymosis EXAM: Right LOWER EXTREMITY VENOUS DOPPLER ULTRASOUND TECHNIQUE: Gray-scale sonography with compression, as well as color and duplex ultrasound, were performed to evaluate the deep venous system(s) from the level of the common femoral vein through the popliteal and proximal calf veins. COMPARISON:  None. FINDINGS: VENOUS Normal compressibility of the common femoral, superficial femoral, and popliteal veins, as well as the visualized calf veins. Visualized portions of profunda femoral vein and great saphenous vein unremarkable. No filling defects to suggest DVT on grayscale or color Doppler imaging. Doppler waveforms show normal direction of venous flow, normal respiratory plasticity and response to augmentation. Limited views of the contralateral common femoral vein are unremarkable. OTHER None. Limitations: none IMPRESSION: No  findings of right lower extremity DVT. Electronically Signed   By: Gaylyn RongWalter  Liebkemann M.D.   On: 01/17/2020 17:32   DG FEMUR, MIN 2 VIEWS RIGHT  Result Date: 01/17/2020 CLINICAL DATA:  Injury, increased pain and swelling.  On Coumadin. EXAM: RIGHT FEMUR 2 VIEWS COMPARISON:  None. FINDINGS: RIGHT femur appears normally aligned. No fracture line or displaced fracture fragment. No degenerative change. Adjacent soft tissues are unremarkable. IMPRESSION: Negative. Electronically Signed   By: Bary RichardStan  Maynard M.D.   On: 01/17/2020 17:53    ____________________________________________    PROCEDURES  Procedure(s) performed:     Procedures     Medications  HYDROcodone-acetaminophen (NORCO/VICODIN) 5-325 MG per tablet 1 tablet (has no administration in time range)  HYDROcodone-acetaminophen (NORCO/VICODIN) 5-325 MG per tablet  1 tablet (1 tablet Oral Given 01/17/20 1820)  ondansetron (ZOFRAN-ODT) disintegrating tablet 4 mg (4 mg Oral Given 01/17/20 1819)     ____________________________________________   INITIAL IMPRESSION / ASSESSMENT AND PLAN / ED COURSE  Pertinent labs & imaging results that were available during my care of the patient were reviewed by me and considered in my medical decision making (see chart for details).      Assessment and Plan:  Right thigh pain:  Hematoma 39 year old male presents to the emergency department with acute right thigh pain following an injury on a slip and slide that occurred a week ago.  Vital signs are reassuring at triage.  On physical exam, right thigh appears one half times the size of the left.  Differential diagnosis included muscle tear/strain hematoma, DVT...  No evidence of DVT on venous ultrasound.  X-ray of the right femur revealed no acute bony abnormality.  We will obtain MRI of right thigh to evaluate for large hematoma.  MRI of right thigh indicates an 88 cm hematoma.  I consulted orthopedist on-call, Dr. Allena Katz who personally  evaluated MRI.  Dr. Allena Katz recommended follow-up with him in the office.  Patient was placed in a knee sleeve per Dr. Eliane Decree recommendation.  Patient can ambulate now with cane and declined assistance with a walker.  Patient was discharged with Norco for pain and a work note was provided.  Recommended that patient follow-up with Dr. Darrold Junker who manages Coumadin to inform him of hematoma.  Return precautions were given to return with new or worsening symptoms.  ____________________________________________  FINAL CLINICAL IMPRESSION(S) / ED DIAGNOSES  Final diagnoses:  Intramuscular hematoma      NEW MEDICATIONS STARTED DURING THIS VISIT:  ED Discharge Orders         Ordered    ondansetron (ZOFRAN ODT) 4 MG disintegrating tablet  Every 8 hours PRN,   Status:  Discontinued        01/17/20 2042    ondansetron (ZOFRAN ODT) 4 MG disintegrating tablet  Every 8 hours PRN        01/17/20 2043    HYDROcodone-acetaminophen (NORCO) 5-325 MG tablet  Every 6 hours PRN        01/17/20 2043              This chart was dictated using voice recognition software/Dragon. Despite best efforts to proofread, errors can occur which can change the meaning. Any change was purely unintentional.     Orvil Feil, PA-C 01/17/20 2050    Willy Eddy, MD 01/17/20 2126

## 2020-01-17 NOTE — ED Triage Notes (Signed)
Pt states his injured his right knee/thigh last Sunday a week ago while playing on a slip and slide with his children and is having increased pain and swelling to the knee and thigh, pt is on coumadin due to have a mechanical heart valve.

## 2020-01-17 NOTE — Discharge Instructions (Addendum)
Please make follow-up appointment with Dr. Allena Katz. Please take Norco every 6 hours for pain. Please reach out to Dr.Paraschos to notify him of hematoma.

## 2020-01-28 ENCOUNTER — Emergency Department: Payer: 59

## 2020-01-28 ENCOUNTER — Emergency Department
Admission: EM | Admit: 2020-01-28 | Discharge: 2020-01-28 | Disposition: A | Discharge status: Home / Self Care | Payer: 59 | Attending: Emergency Medicine | Admitting: Emergency Medicine

## 2020-01-28 ENCOUNTER — Other Ambulatory Visit: Payer: Self-pay

## 2020-01-28 DIAGNOSIS — D688 Other specified coagulation defects: Secondary | ICD-10-CM | POA: Diagnosis not present

## 2020-01-28 DIAGNOSIS — R791 Abnormal coagulation profile: Secondary | ICD-10-CM

## 2020-01-28 DIAGNOSIS — Z952 Presence of prosthetic heart valve: Secondary | ICD-10-CM | POA: Diagnosis not present

## 2020-01-28 DIAGNOSIS — R319 Hematuria, unspecified: Secondary | ICD-10-CM | POA: Insufficient documentation

## 2020-01-28 HISTORY — DX: Gout, unspecified: M10.9

## 2020-01-28 LAB — URINALYSIS, COMPLETE (UACMP) WITH MICROSCOPIC
Bacteria, UA: NONE SEEN
RBC / HPF: >50 RBC/hpf — ABNORMAL HIGH (ref 0–5)
Specific Gravity, Urine: 1.015 (ref 1.005–1.030)
Squamous Epithelial / HPF: NONE SEEN (ref 0–5)

## 2020-01-28 LAB — BASIC METABOLIC PANEL
Anion gap: 13 (ref 5–15)
BUN: 17 mg/dL (ref 6–20)
CO2: 27 mmol/L (ref 22–32)
Calcium: 9.5 mg/dL (ref 8.9–10.3)
Chloride: 96 mmol/L — ABNORMAL LOW (ref 98–111)
Creatinine, Ser: 1.21 mg/dL (ref 0.61–1.24)
GFR calc Af Amer: >60 mL/min (ref >60)
GFR calc non Af Amer: >60 mL/min (ref >60)
Glucose, Bld: 118 mg/dL — ABNORMAL HIGH (ref 70–99)
Potassium: 3.8 mmol/L (ref 3.5–5.1)
Sodium: 136 mmol/L (ref 135–145)

## 2020-01-28 LAB — CBC
HCT: 32.5 % — ABNORMAL LOW (ref 39.0–52.0)
Hemoglobin: 10.7 g/dL — ABNORMAL LOW (ref 13.0–17.0)
MCH: 27.5 pg (ref 26.0–34.0)
MCHC: 32.9 g/dL (ref 30.0–36.0)
MCV: 83.5 fL (ref 80.0–100.0)
Platelets: 609 10*3/uL — ABNORMAL HIGH (ref 150–400)
RBC: 3.89 MIL/uL — ABNORMAL LOW (ref 4.22–5.81)
RDW: 11.7 % (ref 11.5–15.5)
WBC: 11.6 10*3/uL — ABNORMAL HIGH (ref 4.0–10.5)
nRBC: 0 % (ref 0.0–0.2)

## 2020-01-28 LAB — PROTIME-INR
INR: 4.8 (ref 0.8–1.2)
Prothrombin Time: 43.6 seconds — ABNORMAL HIGH (ref 11.4–15.2)

## 2020-01-28 MED ORDER — IOHEXOL 300 MG/ML  SOLN
150.0000 mL | Freq: Once | INTRAMUSCULAR | Status: AC | PRN
  Administered 2020-01-28: 150 mL via INTRAVENOUS

## 2020-01-28 NOTE — ED Notes (Signed)
Patient transported to CT 

## 2020-01-28 NOTE — ED Provider Notes (Signed)
[facility] Emergency Department Provider Note   ____________________________________________   First MD Initiated Contact with Patient 01/28/20 1725     (approximate)  I have reviewed the triage vital signs and the nursing notes.   HISTORY  Chief Complaint Hematuria    HPI Kyle Simmons. is a 39 y.o. male with past medical history of mechanical aortic valve replacement on Coumadin, seizures, and stroke who presents to the ED for hematuria.  Patient reports that he has had gross blood in his urine for the past 3 to 4 days.  He has not had any difficulty passing urine and has not noticed any clots in his urine.  He denies any fevers, dysuria, abdominal pain, or flank pain.  He dealt with similar symptoms only one time previously, when he was diagnosed with a UTI, but states current symptoms feel different.  He does state that his Coumadin doses have been changed multiple times of the past couple of months due to INR being low.  He has been told his goal is between 3 and 3.5 and he is currently taking 5 mg daily with 7.5 mg every third day.        Past Medical History:  Diagnosis Date  . 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 CVA (cerebral vascular accident) [facility])    Patient reports "throwing a clot" to his brain and having a stroke at age 78 due to heart valve replacement  . Gout   . Seizures (HCC)   . Stroke (HCC)   . Testicular abscess     Patient Active Problem List   Diagnosis Date Noted  . Cerebral brain hemorrhage [facility])     Past Surgical History:  Procedure Laterality Date  . AORTIC VALVE REPLACEMENT      Prior to Admission medications   Medication Sig Start Date End Date Taking? Authorizing Provider  oxyCODONE-acetaminophen (ROXICET) 5-325 MG per tablet Take 1 tablet by mouth every 6 (six) hours as needed  for severe pain. 11/18/14   Sharman Cheek, MD  predniSONE (DELTASONE) 20 MG tablet Take 2 tablets (40 mg total) by mouth daily. 11/18/14   Sharman Cheek, MD  SUMAtriptan [facility]) 20 MG/ACT nasal spray Place 1 spray (20 mg total) into the nose as needed for migraine or headache. May repeat in 2 hours if headache persists or recurs. 01/13/19 01/13/20  Loleta Rose, MD    Allergies Cortisone, Sudafed [pseudoephedrine hcl], and Tramadol  No family history on file.  Social History Social History   Tobacco Use  . Smoking status: Never Smoker  . Smokeless tobacco: Never Used  Substance Use Topics  . Alcohol use: No  . Drug use: Not Currently    Review of Systems  Constitutional: No fever/chills Eyes: No visual changes. ENT: No sore throat. Cardiovascular: Denies chest pain. Respiratory: Denies shortness of breath. Gastrointestinal: No abdominal pain.  No nausea, no vomiting.  No diarrhea.  No constipation. Genitourinary: Negative for dysuria.  Positive for hematuria. Musculoskeletal: Negative for back pain. Skin: Negative for rash. Neurological: Negative for headaches, focal weakness or numbness.  ____________________________________________   PHYSICAL EXAM:  VITAL SIGNS: ED Triage Vitals  Enc Vitals Group     BP 01/28/20 1256 122/77     Pulse Rate 01/28/20 1256 (!) 105     Resp 01/28/20 1256 15     Temp  01/28/20 1329 98.7 F (37.1 C)     Temp Source 01/28/20 1256 Oral     SpO2 01/28/20 1256 98 %     Weight 01/28/20 1257 165 lb (74.8 kg)     Height 01/28/20 1257 5\' 5"  (1.651 m)     Head Circumference --      Peak Flow --      Pain Score --      Pain Loc --      Pain Edu? --      Excl. in GC? --     Constitutional: Alert and oriented. Eyes: Conjunctivae are normal. Head: Atraumatic. Nose: No congestion/rhinnorhea. Mouth/Throat: Mucous membranes are moist. Neck: Normal ROM Cardiovascular: Normal rate, regular rhythm. Grossly normal heart sounds. Respiratory:  Normal respiratory effort.  No retractions. Lungs CTAB. Gastrointestinal: Soft and nontender. No distention. Genitourinary: deferred Musculoskeletal: Right thigh hematoma with associated tenderness, improved per patient. Neurologic:  Normal speech and language. No gross focal neurologic deficits are appreciated. Skin:  Skin is warm, dry and intact. No rash noted. Psychiatric: Mood and affect are normal. Speech and behavior are normal.  ____________________________________________   LABS (all labs ordered are listed, but only abnormal results are displayed)  Labs Reviewed  URINALYSIS, COMPLETE (UACMP) WITH MICROSCOPIC - Abnormal; Notable for the following components:      Result Value   Color, Urine RED (*)    APPearance TURBID (*)    Glucose, UA   (*)    Value: TEST NOT REPORTED DUE TO COLOR INTERFERENCE OF URINE PIGMENT   Hgb urine dipstick   (*)    Value: TEST NOT REPORTED DUE TO COLOR INTERFERENCE OF URINE PIGMENT   Bilirubin Urine   (*)    Value: TEST NOT REPORTED DUE TO COLOR INTERFERENCE OF URINE PIGMENT   Ketones, ur   (*)    Value: TEST NOT REPORTED DUE TO COLOR INTERFERENCE OF URINE PIGMENT   Protein, ur   (*)    Value: TEST NOT REPORTED DUE TO COLOR INTERFERENCE OF URINE PIGMENT   Nitrite   (*)    Value: TEST NOT REPORTED DUE TO COLOR INTERFERENCE OF URINE PIGMENT   Leukocytes,Ua   (*)    Value: TEST NOT REPORTED DUE TO COLOR INTERFERENCE OF URINE PIGMENT   RBC / HPF >50 (*)    All other components within normal limits  BASIC METABOLIC PANEL - Abnormal; Notable for the following components:   Chloride 96 (*)    Glucose, Bld 118 (*)    All other components within normal limits  CBC - Abnormal; Notable for the following components:   WBC 11.6 (*)    RBC 3.89 (*)    Hemoglobin 10.7 (*)    HCT 32.5 (*)    Platelets 609 (*)    All other components within normal limits  PROTIME-INR - Abnormal; Notable for the following components:   Prothrombin Time 43.6 (*)    INR  4.8 (*)    All other components within normal limits    PROCEDURES  Procedure(s) performed (including Critical Care):  Procedures   ____________________________________________   INITIAL IMPRESSION / ASSESSMENT AND PLAN / ED COURSE       39 year old male with past medical history of mechanical heart valve on Coumadin, seizures, and stroke who presents to the ED for 3 to 4 days of hematuria.  Patient denies any dysuria or other symptoms to indicate UTI, UA shows gross blood but no white blood cells to indicate infection.  Labs remarkable  for supratherapeutic INR at 4.8.  He has also had a slight drop in his hemoglobin from 14 eleven days ago to 10.7 today.  Some of this hemoglobin drop is likely due to his recent thigh hematoma and he is hemodynamically stable today.  Case discussed with Dr. Lady Gary of cardiology, who agrees that risks of reversal of anticoagulation with vitamin K outweigh benefits.  He recommends that patient hold Coumadin for next 3 days with INR recheck after that.  Case also discussed with Dr. Richardo Hanks of urology, who would be able to follow-up with the patient early next week for cystoscopy, recommend CT urogram be performed here in the ED and he will follow up results.  CT urogram remarkable for nonobstructing nephrolithiasis as well as what is likely clot in the right kidney.  Images reviewed by Dr. Richardo Hanks of urology and patient may be discharged home with urology as well as cardiology follow-up early next week.  Patient had taken his Coumadin already today and as such was advised to hold the Coumadin for the next 3 days with plan to resume on Monday when he may also have his INR rechecked.  It does appear that higher doses of Tylenol can affect Coumadin levels and patient was advised to avoid the Tylenol-containing pain medication that he has been taking for his hematoma.  He was counseled to return to the ED for new worsening symptoms, patient agrees to plan.       ____________________________________________   FINAL CLINICAL IMPRESSION(S) / ED DIAGNOSES  Final diagnoses:  Hematuria  S/P aortic valve replacement with prosthetic valve  Supratherapeutic INR     ED Discharge Orders    None       Note:  This document was prepared using Dragon voice recognition software and may include unintentional dictation errors.   Chesley Noon, MD 01/28/20 2104

## 2020-01-28 NOTE — ED Notes (Signed)
Pt assisted to bathroom by this tech. Pt steady enough to make it to toilet with minimal assistance. Pt back in bed and resting comfortably.

## 2020-01-28 NOTE — ED Triage Notes (Signed)
Pt c/o blood in urine for the past 3-4 days, denies having any. Pt is on coumadin.

## 2020-01-28 NOTE — Discharge Instructions (Signed)
Please hold your Coumadin for the next 3 days, including today.  You should then speak with your cardiology team to have your INR rechecked on Monday.  If you develop increased blood in your urine, difficulty urinating, chest pain, shortness of breath, or other symptoms of anemia, please return to the ER for reevaluation.

## 2020-02-01 ENCOUNTER — Encounter: Payer: Self-pay | Admitting: Urology

## 2020-02-01 ENCOUNTER — Other Ambulatory Visit: Payer: Self-pay

## 2020-02-01 ENCOUNTER — Ambulatory Visit (INDEPENDENT_AMBULATORY_CARE_PROVIDER_SITE_OTHER): Payer: 59 | Admitting: Urology

## 2020-02-01 VITALS — BP 109/77 | HR 99 | Ht 65.0 in | Wt 165.0 lb

## 2020-02-01 DIAGNOSIS — R319 Hematuria, unspecified: Secondary | ICD-10-CM | POA: Diagnosis not present

## 2020-02-01 DIAGNOSIS — R310 Gross hematuria: Secondary | ICD-10-CM

## 2020-02-01 MED ORDER — AMOXICILLIN 500 MG PO CAPS: 500.0000 mg | ORAL_CAPSULE | ORAL | 0 refills | Status: AC

## 2020-02-01 NOTE — Progress Notes (Addendum)
02/01/2020 9:10 AM   Kyle Simmons. 02-10-1981 [id number]  Referring provider: No referring provider defined for this encounter.  Chief Complaint  Patient presents with  . Hematuria    HPI: Patient has a mechanical aortic valve replacement on Coumadin and presented with gross hematuria to the emergency room.  He had been treated for a bladder infection for same symptoms in the past.  His INR was 4.8 and his hemoglobin decreased from 14-10.7.  He had a recent thigh hematoma thought to be the cause.  Cardiology did not want him to reverse his anticoagulation.  They held the Coumadin for 3 days instead.  The emergency room spoke with Dr. Richardo Hanks.   CT scan demonstrated small bilateral stones.  No hydronephrosis.  There was a elongated filling defect in the inferior pole the right kidney and bladder was normal  At the time of the patient's hematuria no frequency burning or flank pain.  Other than passing a few flakes of old blood no hematuria in the last 24 hours.  He voids every 3-4 hours with no nocturia.  No history of kidney stones bladder infections or kidney surgery.  Had a stroke in 1995 related to heart valve.  Normally takes amoxicillin 500 mg x 4 tablets prior to procedures.  No urine culture obtained at time of visit to the emergency room  Currently using crutches due to significant thigh hematoma on right side.  Patient fell when he was playing with his children  He restarted his Coumadin today   PMH: Past Medical History:  Diagnosis Date  . 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 CVA (cerebral vascular accident) [facility])    Patient reports "throwing a clot" to his brain and having a stroke at age 62 due to heart valve replacement  . Gout   . Seizures (HCC)   . Stroke (HCC)   . Testicular abscess     Surgical History: Past Surgical  History:  Procedure Laterality Date  . AORTIC VALVE REPLACEMENT      Home Medications:  Allergies as of 02/01/2020      Reactions   Cortisone    Sudafed [pseudoephedrine Hcl]    Tramadol       Medication List       Accurate as of February 01, 2020  9:10 AM. If you have any questions, ask your nurse or doctor.        oxyCODONE-acetaminophen 5-325 MG tablet Commonly known as: Roxicet Take 1 tablet by mouth every 6 (six) hours as needed for severe pain.   predniSONE 20 MG tablet Commonly known as: Deltasone Take 2 tablets (40 mg total) by mouth daily.   SUMAtriptan 20 MG/ACT nasal spray Commonly known as: IMITREX Place 1 spray (20 mg total) into the nose as needed for migraine or headache. May repeat in 2 hours if headache persists or recurs.       Allergies:  Allergies  Allergen Reactions  . Cortisone   . Sudafed [Pseudoephedrine Hcl]   . Tramadol     Family History: No family history on file.  Social History:  reports that he has never smoked. He has never used smokeless tobacco. He reports previous drug use. He reports that he does not drink alcohol.  ROS:  Physical Exam: There were no vitals taken for this visit.  Constitutional:  Alert and oriented, No acute distress.   Laboratory Data: Lab Results  Component Value Date   WBC 11.6 (H) 01/28/2020   HGB 10.7 (L) 01/28/2020   HCT 32.5 (L) 01/28/2020   MCV 83.5 01/28/2020   PLT 609 (H) 01/28/2020    Lab Results  Component Value Date   CREATININE 1.21 01/28/2020    No results found for: PSA  No results found for: TESTOSTERONE  No results found for: HGBA1C  Urinalysis    Component Value Date/Time   COLORURINE RED (A) 01/28/2020 1300   APPEARANCEUR TURBID (A) 01/28/2020 1300   APPEARANCEUR Clear 10/18/2012 2029   LABSPEC 1.015 01/28/2020 1300   LABSPEC 1.016 10/18/2012 2029   PHURINE  01/28/2020 1300    TEST NOT REPORTED DUE  TO COLOR INTERFERENCE OF URINE PIGMENT   GLUCOSEU (A) 01/28/2020 1300    TEST NOT REPORTED DUE TO COLOR INTERFERENCE OF URINE PIGMENT   GLUCOSEU Negative 10/18/2012 2029   HGBUR (A) 01/28/2020 1300    TEST NOT REPORTED DUE TO COLOR INTERFERENCE OF URINE PIGMENT   BILIRUBINUR (A) 01/28/2020 1300    TEST NOT REPORTED DUE TO COLOR INTERFERENCE OF URINE PIGMENT   BILIRUBINUR Negative 10/18/2012 2029   KETONESUR (A) 01/28/2020 1300    TEST NOT REPORTED DUE TO COLOR INTERFERENCE OF URINE PIGMENT   PROTEINUR (A) 01/28/2020 1300    TEST NOT REPORTED DUE TO COLOR INTERFERENCE OF URINE PIGMENT   NITRITE (A) 01/28/2020 1300    TEST NOT REPORTED DUE TO COLOR INTERFERENCE OF URINE PIGMENT   LEUKOCYTESUR (A) 01/28/2020 1300    TEST NOT REPORTED DUE TO COLOR INTERFERENCE OF URINE PIGMENT   LEUKOCYTESUR Negative 10/18/2012 2029    Pertinent Imaging: Chart reviewed; patient unable to leave urine.  Urine demonstrated microscopic hematuria and a few bacteria.  Urine sent for culture   Assessment & Plan: It appears the patient likely had a renal bleeding secondary to anticoagulation.  Picture was drawn.  He understands he should have a repeat CT scan in about 8 weeks to make certain there is no filling defect.  He should have cystoscopy recognizing its likely to be normal but he did have gross hematuria.  He is a non-smoker.  Because he may need retrogrades in the future I will have him see my partner Dr. Richardo Hanks for cystoscopy.  I did not perform today since he will need the amoxicillin.  Prescription given and he will take 1 hour prior to cystoscopy.  Differential diagnosis discussed.  Patient reluctant to have cystoscopy but will have the procedure to make absolutely sure he does not have a bladder pathology.  Call if urine culture is positive  There are no diagnoses linked to this encounter.  No follow-ups on file.  Martina Sinner, MD  [facility] [address] [place], [place] [address] [phone]

## 2020-02-01 NOTE — Patient Instructions (Signed)

## 2020-02-01 NOTE — Addendum Note (Signed)
Addended by: Lizbeth Bark on: 02/01/2020 10:08 AM   Modules accepted: Orders

## 2020-02-01 NOTE — Addendum Note (Signed)
Addended by: Alfredo Martinez on: 02/01/2020 10:30 AM   Modules accepted: Level of Service

## 2020-02-02 LAB — MICROSCOPIC EXAMINATION

## 2020-02-02 LAB — URINALYSIS, COMPLETE
Bilirubin, UA: NEGATIVE
Glucose, UA: NEGATIVE
Ketones, UA: NEGATIVE
Leukocytes,UA: NEGATIVE
Nitrite, UA: NEGATIVE
Protein,UA: NEGATIVE
Specific Gravity, UA: 1.025 (ref 1.005–1.030)
Urobilinogen, Ur: 0.2 mg/dL (ref 0.2–1.0)
pH, UA: 5.5 (ref 5.0–7.5)

## 2020-02-03 ENCOUNTER — Ambulatory Visit: Payer: Self-pay | Admitting: Urology

## 2020-02-05 LAB — CULTURE, URINE COMPREHENSIVE

## 2020-02-10 ENCOUNTER — Other Ambulatory Visit: Payer: Self-pay

## 2020-02-10 ENCOUNTER — Ambulatory Visit (INDEPENDENT_AMBULATORY_CARE_PROVIDER_SITE_OTHER): Payer: 59 | Admitting: Urology

## 2020-02-10 ENCOUNTER — Encounter: Payer: Self-pay | Admitting: Urology

## 2020-02-10 ENCOUNTER — Other Ambulatory Visit: Payer: Self-pay | Admitting: Urology

## 2020-02-10 VITALS — BP 113/79 | HR 89 | Ht 65.0 in | Wt 165.0 lb

## 2020-02-10 DIAGNOSIS — R310 Gross hematuria: Secondary | ICD-10-CM | POA: Diagnosis not present

## 2020-02-10 DIAGNOSIS — R319 Hematuria, unspecified: Secondary | ICD-10-CM

## 2020-02-10 NOTE — Progress Notes (Signed)
Cystoscopy Procedure Note:  Indication: Gross hematuria  Briefly, 39 year old male with mechanical aortic valve on Coumadin who presented to the ED with gross hematuria and a supratherapeutic INR of 4.8.  CT urogram was essentially benign aside from a possible filling defect in the right kidney most consistent with a clot.  His hematuria has since resolved.  He took amoxicillin prior to this procedure today with his history of mechanical valve.  After informed consent and discussion of the procedure and its risks, Kyle Simmons. was positioned and prepped in the standard fashion. Cystoscopy was performed with a flexible cystoscope. The urethra, bladder neck and entire bladder was visualized in a standard fashion. The prostate was small. The ureteral orifices were visualized in their normal location and orientation.  No mucosal abnormalities.  Cytology sent  Imaging: CT urogram 01/28/2020 with right renal filling defect most consistent with clot  Findings: Normal cystoscopy  Assessment and Plan: Suspect gross hematuria was secondary to supratherapeutic INR.  Call with cytology results.  RTC 2 months with repeat CT urogram to confirm resolution of filling defect that was most consistent with clot.  Legrand Rams, MD 02/10/2020

## 2020-02-11 LAB — CYTOLOGY - NON PAP

## 2020-02-12 ENCOUNTER — Telehealth: Payer: Self-pay

## 2020-02-12 NOTE — Telephone Encounter (Signed)
Called pt no answer left detailed message for pt per DPR. Advised to call back for questions or concerns.

## 2020-02-12 NOTE — Telephone Encounter (Signed)
-----   Message from Sondra Come, MD sent at 02/11/2020  4:38 PM EDT ----- No cancer cells in urine, keep follow up for CT as scheduled  Legrand Rams, MD 02/11/2020

## 2020-04-13 ENCOUNTER — Ambulatory Visit: Payer: Self-pay | Admitting: Urology

## 2020-04-14 ENCOUNTER — Encounter: Payer: Self-pay | Admitting: Urology

## 2020-05-19 ENCOUNTER — Emergency Department: Payer: 59

## 2020-05-19 ENCOUNTER — Emergency Department
Admission: EM | Admit: 2020-05-19 | Discharge: 2020-05-19 | Disposition: A | Discharge status: Home / Self Care | Payer: 59 | Attending: Emergency Medicine | Admitting: Emergency Medicine

## 2020-05-19 ENCOUNTER — Other Ambulatory Visit: Payer: Self-pay

## 2020-05-19 ENCOUNTER — Encounter: Payer: Self-pay | Admitting: Emergency Medicine

## 2020-05-19 DIAGNOSIS — Z8669 Personal history of other diseases of the nervous system and sense organs: Secondary | ICD-10-CM | POA: Insufficient documentation

## 2020-05-19 DIAGNOSIS — Z79899 Other long term (current) drug therapy: Secondary | ICD-10-CM | POA: Diagnosis not present

## 2020-05-19 DIAGNOSIS — Z7901 Long term (current) use of anticoagulants: Secondary | ICD-10-CM | POA: Diagnosis not present

## 2020-05-19 DIAGNOSIS — R200 Anesthesia of skin: Secondary | ICD-10-CM

## 2020-05-19 DIAGNOSIS — R202 Paresthesia of skin: Secondary | ICD-10-CM

## 2020-05-19 DIAGNOSIS — Z8673 Personal history of transient ischemic attack (TIA), and cerebral infarction without residual deficits: Secondary | ICD-10-CM | POA: Insufficient documentation

## 2020-05-19 DIAGNOSIS — Z8679 Personal history of other diseases of the circulatory system: Secondary | ICD-10-CM | POA: Diagnosis not present

## 2020-05-19 DIAGNOSIS — Z7982 Long term (current) use of aspirin: Secondary | ICD-10-CM | POA: Insufficient documentation

## 2020-05-19 LAB — CBC WITH DIFFERENTIAL/PLATELET
Abs Immature Granulocytes: 0.01 10*3/uL (ref 0.00–0.07)
Basophils Absolute: 0 10*3/uL (ref 0.0–0.1)
Basophils Relative: 1 %
Eosinophils Absolute: 0.1 10*3/uL (ref 0.0–0.5)
Eosinophils Relative: 2 %
HCT: 43.7 % (ref 39.0–52.0)
Hemoglobin: 14.3 g/dL (ref 13.0–17.0)
Immature Granulocytes: 0 %
Lymphocytes Relative: 17 %
Lymphs Abs: 1.2 10*3/uL (ref 0.7–4.0)
MCH: 27 pg (ref 26.0–34.0)
MCHC: 32.7 g/dL (ref 30.0–36.0)
MCV: 82.5 fL (ref 80.0–100.0)
Monocytes Absolute: 0.5 10*3/uL (ref 0.1–1.0)
Monocytes Relative: 7 %
Neutro Abs: 5.1 10*3/uL (ref 1.7–7.7)
Neutrophils Relative %: 73 %
Platelets: 236 10*3/uL (ref 150–400)
RBC: 5.3 MIL/uL (ref 4.22–5.81)
RDW: 13.1 % (ref 11.5–15.5)
WBC: 7 10*3/uL (ref 4.0–10.5)
nRBC: 0 % (ref 0.0–0.2)

## 2020-05-19 LAB — COMPREHENSIVE METABOLIC PANEL
ALT: 22 U/L (ref 0–44)
AST: 20 U/L (ref 15–41)
Albumin: 5 g/dL (ref 3.5–5.0)
Alkaline Phosphatase: 64 U/L (ref 38–126)
Anion gap: 11 (ref 5–15)
BUN: 16 mg/dL (ref 6–20)
CO2: 27 mmol/L (ref 22–32)
Calcium: 9.4 mg/dL (ref 8.9–10.3)
Chloride: 101 mmol/L (ref 98–111)
Creatinine, Ser: 1.24 mg/dL (ref 0.61–1.24)
GFR, Estimated: >60 mL/min (ref >60)
Glucose, Bld: 105 mg/dL — ABNORMAL HIGH (ref 70–99)
Potassium: 3.9 mmol/L (ref 3.5–5.1)
Sodium: 139 mmol/L (ref 135–145)
Total Bilirubin: 0.8 mg/dL (ref 0.3–1.2)
Total Protein: 7.6 g/dL (ref 6.5–8.1)

## 2020-05-19 LAB — TROPONIN I (HIGH SENSITIVITY)
Troponin I (High Sensitivity): 10 ng/L (ref <18)
Troponin I (High Sensitivity): 11 ng/L (ref <18)

## 2020-05-19 LAB — PROTIME-INR
INR: 3.3 — ABNORMAL HIGH (ref 0.8–1.2)
Prothrombin Time: 32.4 seconds — ABNORMAL HIGH (ref 11.4–15.2)

## 2020-05-19 NOTE — ED Triage Notes (Signed)
Patient ambulatory to triage with steady gait, without difficulty or distress noted; pt reports awoke from sleeping around 1am and felt numbness to left side of body; symptoms resolved quickly (after ) but c/o generalized HA at present; st hx CVA and currently taking coumadin

## 2020-05-19 NOTE — Discharge Instructions (Signed)
As we discussed, please continue all your typical medications.  Your INR was appropriate at 3.3 today.  Follow-up with your neurologist as we discussed.  Use Tylenol for pain and fevers.  Up to 1000 mg per dose, up to 4 times per day.  Do not take more than 4000 mg of Tylenol/acetaminophen within 24 hours..  If you develop any further worsening symptoms, particularly numbness or tingling that will not go away or new headaches, please return to the ED.

## 2020-05-19 NOTE — ED Provider Notes (Signed)
[facility] Emergency Department Provider Note ____________________________________________   Event Date/Time   First MD Initiated Contact with Patient 05/19/20 [phone]     (approximate)  I have reviewed the triage vital signs and the nursing notes.  HISTORY  Chief Complaint Numbness   HPI Kyle Simmons. is a 40 y.o. Kyle Simmons presents to the ED for evaluation of left-sided numbness.  Chart review indicates history of aortic valve replacement on Coumadin, subsequently causing a ischemic stroke.  No residual deficits.  Patient further reports a headache and migraine history, for which he is on Nurtec.  Patient reports missing a dose of his Nurtec, and having a typical headache for him about 4 days ago that has since resolved.  Patient reports feeling normal yesterday, reports waking up in the middle of the night with left-sided numbness reminding him of his previous stroke.  He reports ambulating, with some difficulty due to the numbness, down the hallway from his bedroom and his symptoms immediately began resolving.  He reports they have not recurred and he reports he feels normal now.  They lasted about 5 minutes in total.  Denies any emesis, syncope, head trauma, falls or recurrence of the symptoms.  He reports fear of additional stroke due to his history of such.   Past Medical History:  Diagnosis Date  . 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 CVA (cerebral vascular accident) [facility])    Patient reports "throwing a clot" to his brain and having a stroke at age 26 due to heart valve replacement  . Gout   . Seizures (HCC)   . Stroke (HCC)   . Testicular abscess     Patient Active Problem List   Diagnosis Date Noted  . Cerebral brain hemorrhage [facility])     Past Surgical History:  Procedure Laterality Date  . AORTIC VALVE  REPLACEMENT      Prior to Admission medications   Medication Sig Start Date End Date Taking? Authorizing Provider  acetaminophen (TYLENOL) 500 MG tablet Take by mouth.    [provider]  amoxicillin (AMOXIL) 500 MG capsule Take 1 capsule (500 mg total) by mouth See admin instructions. Take 4 capsules 1 hour before procedure. 02/01/20   Alfredo Martinez, MD  aspirin 81 MG EC tablet Take by mouth.    [provider]  HYDROcodone-acetaminophen (NORCO) 7.5-325 MG tablet Take 1 tablet by mouth every 6 (six) hours as needed. 01/26/20   [provider]  lamoTRIgine (LAMICTAL) 200 MG tablet Take 200 mg by mouth 2 (two) times daily. 12/01/19   [provider]  levETIRAcetam (KEPPRA) 1000 MG tablet Take by mouth. 07/07/19   [provider]  metoprolol succinate (TOPROL-XL) 25 MG 24 hr tablet Take 25 mg by mouth daily. 11/23/19   [provider]  NURTEC 75 MG TBDP Take by mouth. 01/18/20   [provider]  simvastatin (ZOCOR) 20 MG tablet Take 20 mg by mouth at bedtime. 11/23/19   [provider]  warfarin (COUMADIN) 5 MG tablet Take 5 mg by mouth daily. 12/21/19   [provider]    Allergies Cortisone, Sudafed [pseudoephedrine hcl], and Tramadol  No family history on file.  Social History Social History   Tobacco Use  . Smoking status: Never Smoker  . Smokeless tobacco: Never Used  Vaping Use  . Vaping Use: Never  used  Substance Use Topics  . Alcohol use: No  . Drug use: Not Currently    Review of Systems  Constitutional: No fever/chills Eyes: No visual changes. ENT: No sore throat. Cardiovascular: Denies chest pain. Respiratory: Denies shortness of breath. Gastrointestinal: No abdominal pain.  No nausea, no vomiting.  No diarrhea.  No constipation. Genitourinary: Negative for dysuria. Musculoskeletal: Negative for back pain. Skin: Negative for rash. Neurological: Positive for headaches, positive for  left-sided weakness and paresthesias.  ____________________________________________   PHYSICAL EXAM:  VITAL SIGNS: Vitals:   05/19/20 0159 05/19/20 0609  BP: (!) 129/91 (!) 141/84  Pulse: 84 71  Resp: 20 16  Temp: 98.3 F (36.8 C)   SpO2: 95% 98%     Constitutional: Alert and oriented. Well appearing and in no acute distress.  Ambulatory with normal gait.  Pleasant and conversational in full sentences. Eyes: Conjunctivae are normal. PERRL. EOMI. Head: Atraumatic. Nose: No congestion/rhinnorhea. Mouth/Throat: Mucous membranes are moist.  Oropharynx non-erythematous. Neck: No stridor. No cervical spine tenderness to palpation. Cardiovascular: Normal rate, regular rhythm. Grossly normal heart sounds.  Good peripheral circulation. Respiratory: Normal respiratory effort.  No retractions. Lungs CTAB. Gastrointestinal: Soft , nondistended, nontender to palpation. No CVA tenderness. Musculoskeletal: No lower extremity tenderness nor edema.  No joint effusions. No signs of acute trauma. Neurologic:  Normal speech and language. No gross focal neurologic deficits are appreciated. No gait instability noted. Cranial nerves II through XII intact 5/5 strength and sensation in all 4 extremities Skin:  Skin is warm, dry and intact. No rash noted. Psychiatric: Mood and affect are normal. Speech and behavior are normal.  ____________________________________________   LABS (all labs ordered are listed, but only abnormal results are displayed)  Labs Reviewed  COMPREHENSIVE METABOLIC PANEL - Abnormal; Notable for the following components:      Result Value   Glucose, Bld 105 (*)    All other components within normal limits  PROTIME-INR - Abnormal; Notable for the following components:   Prothrombin Time 32.4 (*)    INR 3.3 (*)    All other components within normal limits  CBC WITH DIFFERENTIAL/PLATELET  TROPONIN I (HIGH SENSITIVITY)  TROPONIN I (HIGH SENSITIVITY)    ____________________________________________  12 Lead EKG  Sinus rhythm, rate of 83 bpm.  Normal axis.  Normal intervals.  T wave inversions isolated to 3 and aVF.  Very mild, sub-1 mm ST depressions to inferior and lateral leads.  Very similar to previous from June 2021.  No significant change ____________________________________________  RADIOLOGY  ED MD interpretation: CT head reviewed by me without evidence of acute intracranial pathology.  Official radiology report(s): CT Head Wo Contrast  Result Date: 05/19/2020 CLINICAL DATA:  Transient ischemic attack EXAM: CT HEAD WITHOUT CONTRAST TECHNIQUE: Contiguous axial images were obtained from the base of the skull through the vertex without intravenous contrast. COMPARISON:  MRI 10/13/2019 FINDINGS: Brain: Remote left occipital infarct is again identified. Focal area of encephalomalacia within the right frontal lobe again noted likely related to prior ventriculostomy. No evidence of acute intracranial hemorrhage or infarct. No abnormal mass effect or midline shift. No abnormal intra or extra-axial mass lesion or fluid collection. Ventricular size is normal. Cerebellum unremarkable. Vascular: No asymmetric hyperdense vasculature at the skull base. Skull: Changes of remote left occipital craniotomy are identified. No acute fracture. Sinuses/Orbits: The orbits are unremarkable. Several mucous retention cysts are partially visualized within the left maxillary sinus. Remaining paranasal sinuses are clear. Other: Mastoid air cells and middle ear cavities are  clear. IMPRESSION: No acute intracranial hemorrhage or infarct. Remote left occipital infarct again noted. Electronically Signed   By: Fidela Salisbury MD   On: 05/19/2020 04:09    ____________________________________________   PROCEDURES and INTERVENTIONS  Procedure(s) performed (including Critical Care):  Procedures  Medications - No data to  display  ____________________________________________   MDM / ED COURSE   40 year old male s/p AVR, previous stroke on Coumadin, presents to the ED with transient and short-lived paresthesias on his left side, without evidence of acute pathology and amenable to outpatient management.  Normal vitals on room air.  Exam reassuring without evidence of acute pathology.  He is in no distress, has no evidence of trauma or neurovascular deficits.  Patient reports sleeping on his side at night, waking up with left-sided paresthesias and numbness that immediately self resolved and has not recurred.  He has no evidence of acute pathology to warrant additional work-up here in the ED.  His anticoagulation is therapeutic considering his AVR, and CT head shows no evidence of ICH.  We thoroughly discussed return precautions to the ED, and closely following up with his PCP and neurologist.  Patient medically stable for discharge home.      ____________________________________________   FINAL CLINICAL IMPRESSION(S) / ED DIAGNOSES  Final diagnoses:  Left sided numbness  Paresthesias  Anticoagulated     ED Discharge Orders    None       Michiko Lineman [place]   Note:  This document was prepared using Dragon voice recognition software and may include unintentional dictation errors.   Vladimir Crofts, MD 05/19/20 [phone]

## 2021-02-21 ENCOUNTER — Emergency Department: Payer: 59

## 2021-02-21 ENCOUNTER — Emergency Department
Admission: EM | Admit: 2021-02-21 | Discharge: 2021-02-21 | Disposition: A | Discharge status: Home / Self Care | Payer: 59 | Attending: Emergency Medicine | Admitting: Emergency Medicine

## 2021-02-21 ENCOUNTER — Other Ambulatory Visit: Payer: Self-pay

## 2021-02-21 DIAGNOSIS — Z7982 Long term (current) use of aspirin: Secondary | ICD-10-CM | POA: Insufficient documentation

## 2021-02-21 DIAGNOSIS — R1010 Upper abdominal pain, unspecified: Secondary | ICD-10-CM | POA: Diagnosis not present

## 2021-02-21 DIAGNOSIS — Z7901 Long term (current) use of anticoagulants: Secondary | ICD-10-CM | POA: Insufficient documentation

## 2021-02-21 DIAGNOSIS — Z79899 Other long term (current) drug therapy: Secondary | ICD-10-CM | POA: Insufficient documentation

## 2021-02-21 LAB — CBC
HCT: 47.1 % (ref 39.0–52.0)
Hemoglobin: 16.4 g/dL (ref 13.0–17.0)
MCH: 29.2 pg (ref 26.0–34.0)
MCHC: 34.8 g/dL (ref 30.0–36.0)
MCV: 84 fL (ref 80.0–100.0)
Platelets: 276 10*3/uL (ref 150–400)
RBC: 5.61 MIL/uL (ref 4.22–5.81)
RDW: 12 % (ref 11.5–15.5)
WBC: 10.8 10*3/uL — ABNORMAL HIGH (ref 4.0–10.5)
nRBC: 0 % (ref 0.0–0.2)

## 2021-02-21 LAB — URINALYSIS, COMPLETE (UACMP) WITH MICROSCOPIC
Bacteria, UA: NONE SEEN
Bilirubin Urine: NEGATIVE
Glucose, UA: NEGATIVE mg/dL
Ketones, ur: NEGATIVE mg/dL
Leukocytes,Ua: NEGATIVE
Nitrite: NEGATIVE
Protein, ur: NEGATIVE mg/dL
Specific Gravity, Urine: 1.033 — ABNORMAL HIGH (ref 1.005–1.030)
Squamous Epithelial / HPF: NONE SEEN (ref 0–5)
pH: 7 (ref 5.0–8.0)

## 2021-02-21 LAB — COMPREHENSIVE METABOLIC PANEL
ALT: 21 U/L (ref 0–44)
AST: 21 U/L (ref 15–41)
Albumin: 5.1 g/dL — ABNORMAL HIGH (ref 3.5–5.0)
Alkaline Phosphatase: 61 U/L (ref 38–126)
Anion gap: 11 (ref 5–15)
BUN: 12 mg/dL (ref 6–20)
CO2: 31 mmol/L (ref 22–32)
Calcium: 10.4 mg/dL — ABNORMAL HIGH (ref 8.9–10.3)
Chloride: 99 mmol/L (ref 98–111)
Creatinine, Ser: 1.22 mg/dL (ref 0.61–1.24)
GFR, Estimated: >60 mL/min (ref >60)
Glucose, Bld: 106 mg/dL — ABNORMAL HIGH (ref 70–99)
Potassium: 4.2 mmol/L (ref 3.5–5.1)
Sodium: 141 mmol/L (ref 135–145)
Total Bilirubin: 1.1 mg/dL (ref 0.3–1.2)
Total Protein: 8.2 g/dL — ABNORMAL HIGH (ref 6.5–8.1)

## 2021-02-21 LAB — LIPASE, BLOOD: Lipase: 32 U/L (ref 11–51)

## 2021-02-21 LAB — TROPONIN I (HIGH SENSITIVITY)
Troponin I (High Sensitivity): 8 ng/L (ref <18)
Troponin I (High Sensitivity): 7 ng/L (ref <18)

## 2021-02-21 LAB — PROTIME-INR
INR: 2 — ABNORMAL HIGH (ref 0.8–1.2)
Prothrombin Time: 22.6 seconds — ABNORMAL HIGH (ref 11.4–15.2)

## 2021-02-21 MED ORDER — FAMOTIDINE 20 MG PO TABS: 20.0000 mg | ORAL_TABLET | Freq: Two times a day (BID) | ORAL | 0 refills | Status: DC

## 2021-02-21 MED ORDER — PANTOPRAZOLE SODIUM 40 MG PO TBEC: 40.0000 mg | DELAYED_RELEASE_TABLET | Freq: Every day | ORAL | 1 refills | Status: AC

## 2021-02-21 MED ORDER — METOCLOPRAMIDE HCL 10 MG PO TABS: 10.0000 mg | ORAL_TABLET | Freq: Four times a day (QID) | ORAL | 0 refills | Status: AC | PRN

## 2021-02-21 MED ORDER — KETOROLAC TROMETHAMINE 30 MG/ML IJ SOLN
15.0000 mg | INTRAMUSCULAR | Status: DC
Start: 2021-02-21 — End: 2021-02-21

## 2021-02-21 MED ORDER — ONDANSETRON HCL 4 MG/2ML IJ SOLN
4.0000 mg | Freq: Once | INTRAMUSCULAR | Status: AC
  Administered 2021-02-21: 4 mg via INTRAVENOUS
  Filled 2021-02-21: qty 2

## 2021-02-21 MED ORDER — IOHEXOL 350 MG/ML SOLN
80.0000 mL | Freq: Once | INTRAVENOUS | Status: AC | PRN
  Administered 2021-02-21: 80 mL via INTRAVENOUS
  Filled 2021-02-21: qty 80

## 2021-02-21 MED ORDER — SENNOSIDES-DOCUSATE SODIUM 8.6-50 MG PO TABS: 2.0000 | ORAL_TABLET | Freq: Two times a day (BID) | ORAL | 0 refills | Status: AC

## 2021-02-21 MED ORDER — MORPHINE SULFATE (PF) 4 MG/ML IV SOLN
4.0000 mg | Freq: Once | INTRAVENOUS | Status: AC
  Administered 2021-02-21: 4 mg via INTRAVENOUS
  Filled 2021-02-21: qty 1

## 2021-02-21 MED ORDER — PANTOPRAZOLE SODIUM 40 MG IV SOLR
40.0000 mg | Freq: Once | INTRAVENOUS | Status: AC
  Administered 2021-02-21: 40 mg via INTRAVENOUS
  Filled 2021-02-21: qty 40

## 2021-02-21 MED ORDER — SODIUM CHLORIDE 0.9 % IV BOLUS
1000.0000 mL | Freq: Once | INTRAVENOUS | Status: AC
  Administered 2021-02-21: 1000 mL via INTRAVENOUS

## 2021-02-21 NOTE — ED Triage Notes (Signed)
Pt to ED for epigastric pain that started at 130 this am, woke from sleep.  +nausea.  States took pepto bismol with no relief PTA

## 2021-02-21 NOTE — Discharge Instructions (Signed)
Return to the ER if you have worsening pain, inability to have a bowel movement or pass gas, vomiting, or fever .

## 2021-02-21 NOTE — ED Provider Notes (Signed)
[facility] Emergency Department Provider Note  ____________________________________________  Time seen: Approximately 12:59 PM  I have reviewed the triage vital signs and the nursing notes.   HISTORY  Chief Complaint Abdominal Pain    HPI Kyle Cavitt. is a 40 y.o. male  With a past history of stroke, seizures, aortic valve replacement on Coumadin who complains of diffuse upper abdominal pain that started at 1 AM today.  Constant, not relieved with Pepto-Bismol, Tums, or famotidine.  Pain is nonradiating.  Worse with eating and drinking, no alleviating factors.  8/10 in intensity.     Past Medical History:  Diagnosis Date   Cerebral brain hemorrhage (HCC)    When patient was 57, he reports he had a brain hemorrhage following a thrombotic ischemic stroke from his heart valve replacement   CVA (cerebral vascular accident) [facility])    Patient reports "throwing a clot" to his brain and having a stroke at age 20 due to heart valve replacement   Gout    Seizures (HCC)    Stroke [facility])    Testicular abscess      Patient Active Problem List   Diagnosis Date Noted   Cerebral brain hemorrhage [facility])      Past Surgical History:  Procedure Laterality Date   AORTIC VALVE REPLACEMENT       Prior to Admission medications   Medication Sig Start Date End Date Taking? Authorizing Provider  famotidine (PEPCID) 20 MG tablet Take 1 tablet (20 mg total) by mouth 2 (two) times daily. 02/21/21  Yes Sharman Cheek, MD  metoCLOPramide (REGLAN) 10 MG tablet Take 1 tablet (10 mg total) by mouth every 6 (six) hours as needed. 02/21/21  Yes Sharman Cheek, MD  senna-docusate (SENOKOT-S) 8.6-50 MG tablet Take 2 tablets by mouth 2 (two) times daily. 02/21/21  Yes Sharman Cheek, MD  acetaminophen (TYLENOL) 500 MG tablet Take by mouth.    [provider]  amoxicillin (AMOXIL) 500 MG capsule Take 1 capsule (500 mg total) by mouth See admin instructions.  Take 4 capsules 1 hour before procedure. 02/01/20   Alfredo Martinez, MD  aspirin 81 MG EC tablet Take by mouth.    [provider]  HYDROcodone-acetaminophen (NORCO) 7.5-325 MG tablet Take 1 tablet by mouth every 6 (six) hours as needed. 01/26/20   [provider]  lamoTRIgine (LAMICTAL) 200 MG tablet Take 200 mg by mouth 2 (two) times daily. 12/01/19   [provider]  levETIRAcetam (KEPPRA) 1000 MG tablet Take by mouth. 07/07/19   [provider]  metoprolol succinate (TOPROL-XL) 25 MG 24 hr tablet Take 25 mg by mouth daily. 11/23/19   [provider]  NURTEC 75 MG TBDP Take by mouth. 01/18/20   [provider]  simvastatin (ZOCOR) 20 MG tablet Take 20 mg by mouth at bedtime. 11/23/19   [provider]  warfarin (COUMADIN) 5 MG tablet Take 5 mg by mouth daily. 12/21/19   [provider]     Allergies Cortisone, Sudafed [pseudoephedrine hcl], and Tramadol   No family history on file.  Social History Social History   Tobacco Use   Smoking status: Never   Smokeless tobacco: Never  Vaping Use   Vaping Use: Never used  Substance Use Topics   Alcohol use: No   Drug use: Not Currently    Review of Systems  Constitutional:   No fever or chills.  ENT:   No sore throat. No rhinorrhea. Cardiovascular:   No chest pain  or syncope. Respiratory:   No dyspnea or cough. Gastrointestinal: Positive as above for abdominal pain without vomiting or diarrhea or constipation Musculoskeletal:   Negative for focal pain or swelling All other systems reviewed and are negative except as documented above in ROS and HPI.  ____________________________________________   PHYSICAL EXAM:  VITAL SIGNS: ED Triage Vitals  Enc Vitals Group     BP 02/21/21 0838 (!) 154/97     Pulse Rate 02/21/21 0838 73     Resp 02/21/21 0838 18     Temp 02/21/21 0838 98.3 F (36.8 C)     Temp Source 02/21/21 0838 Oral     SpO2 02/21/21 0838 96 %      Weight 02/21/21 0839 175 lb (79.4 kg)     Height 02/21/21 0839 5\' 5"  (1.651 m)     Head Circumference --      Peak Flow --      Pain Score 02/21/21 0839 10     Pain Loc --      Pain Edu? --      Excl. in [place]? --     Vital signs reviewed, nursing assessments reviewed.   Constitutional:   Alert and oriented. Non-toxic appearance. Eyes:   Conjunctivae are normal. EOMI. PERRL. ENT      Head:   Normocephalic and atraumatic.      Nose:   Wearing a mask.      Mouth/Throat:   Wearing a mask.      Neck:   No meningismus. Full ROM. Hematological/Lymphatic/Immunilogical:   No cervical lymphadenopathy. Cardiovascular:   RRR. Symmetric bilateral radial and DP pulses.  No murmurs. Cap refill less than 2 seconds. Respiratory:   Normal respiratory effort without tachypnea/retractions. Breath sounds are clear and equal bilaterally. No wheezes/rales/rhonchi. Gastrointestinal:   Soft with diffuse upper abdominal tenderness. Non distended. There is no CVA tenderness.  No rebound or rigidity, but patient does seem to be guarding somewhat.. Genitourinary:   deferred Musculoskeletal:   Normal range of motion in all extremities. No joint effusions.  No lower extremity tenderness.  No edema. Neurologic:   Normal speech and language.  Motor grossly intact. No acute focal neurologic deficits are appreciated.  Skin:    Skin is warm, dry and intact. No rash noted.  No petechiae, purpura, or bullae.  ____________________________________________    LABS (pertinent positives/negatives) (all labs ordered are listed, but only abnormal results are displayed) Labs Reviewed  COMPREHENSIVE METABOLIC PANEL - Abnormal; Notable for the following components:      Result Value   Glucose, Bld 106 (*)    Calcium 10.4 (*)    Total Protein 8.2 (*)    Albumin 5.1 (*)    All other components within normal limits  CBC - Abnormal; Notable for the following components:   WBC 10.8 (*)    All other components within normal  limits  URINALYSIS, COMPLETE (UACMP) WITH MICROSCOPIC - Abnormal; Notable for the following components:   Color, Urine COLORLESS (*)    APPearance CLEAR (*)    Specific Gravity, Urine 1.033 (*)    Hgb urine dipstick SMALL (*)    All other components within normal limits  PROTIME-INR - Abnormal; Notable for the following components:   Prothrombin Time 22.6 (*)    INR 2.0 (*)    All other components within normal limits  LIPASE, BLOOD  TROPONIN I (HIGH SENSITIVITY)  TROPONIN I (HIGH SENSITIVITY)   ____________________________________________   EKG    ____________________________________________  RADIOLOGY  CT ABDOMEN PELVIS W CONTRAST  Result Date: 02/21/2021 CLINICAL DATA:  Epigastric pain, abdominal pain, nausea EXAM: CT ABDOMEN AND PELVIS WITH CONTRAST TECHNIQUE: Multidetector CT imaging of the abdomen and pelvis was performed using the standard protocol following bolus administration of intravenous contrast. CONTRAST:  35mL OMNIPAQUE IOHEXOL 350 MG/ML SOLN COMPARISON:  01/28/2020 FINDINGS: Lower chest: No acute abnormality. Hepatobiliary: Scattered hypodensities in the liver, difficult to characterize due to their small size but likely small cysts. Index lesion in the right hepatic lobe measures 6 mm. Small gallstone layering in the gallbladder. No visible biliary ductal stones or dilatation. Pancreas: No focal abnormality or ductal dilatation. Spleen: No focal abnormality.  Normal size. Adrenals/Urinary Tract: No adrenal abnormality. No focal renal abnormality. No stones or hydronephrosis. Urinary bladder is unremarkable. Stomach/Bowel: Dilated small bowel loops in the abdomen and pelvis with decompressed distal small bowel loops. Exact transition is not visualized but likely in the right lower quadrant. In addition, there is an appendicolith within the proximal appendix. Distal to the appendicolith, the appendix is normal caliber, 6 mm. Stomach is moderately  distended/fluid-filled. Large bowel grossly unremarkable. Vascular/Lymphatic: No evidence of aneurysm or adenopathy. Reproductive: No visible focal abnormality. Other: Small amount of free fluid in the right lower quadrant adjacent to distal small bowel loops. No free air. Musculoskeletal: No acute bony abnormality. IMPRESSION: Dilated fluid-filled small bowel loops extend into the pelvis. Distal small bowel loops are decompressed. Findings are concerning for distal small bowel obstruction. Exact transition or cause not visualized. Appendicolith within the proximal appendix, but the appendix remains normal caliber without surrounding inflammation. Scattered hypodensities in the liver, 6 mm or less in size, likely small cysts. Single small layering gallstone in the gallbladder neck. Electronically Signed   By: Charlett Nose M.D.   On: 02/21/2021 13:02    ____________________________________________   PROCEDURES Procedures  ____________________________________________  DIFFERENTIAL DIAGNOSIS   Pancreatitis, biliary disease, gastritis, diverticulitis, intra-abdominal abscess  CLINICAL IMPRESSION / ASSESSMENT AND PLAN / ED COURSE  Medications ordered in the ED: Medications  sodium chloride 0.9 % bolus 1,000 mL (0 mLs Intravenous Stopped 02/21/21 1306)  ondansetron (ZOFRAN) injection 4 mg (4 mg Intravenous Given 02/21/21 1206)  pantoprazole (PROTONIX) injection 40 mg (40 mg Intravenous Given 02/21/21 1207)  morphine 4 MG/ML injection 4 mg (4 mg Intravenous Given 02/21/21 1207)  iohexol (OMNIPAQUE) 350 MG/ML injection 80 mL (80 mLs Intravenous Contrast Given 02/21/21 1217)    Pertinent labs & imaging results that were available during my care of the patient were reviewed by me and considered in my medical decision making (see chart for details).  Kyle Shober. was evaluated in Emergency Department on 02/21/2021 for the symptoms described in the history of present illness. He was evaluated in  the context of the global COVID-19 pandemic, which necessitated consideration that the patient might be at risk for infection with the SARS-CoV-2 virus that causes COVID-19. Institutional protocols and algorithms that pertain to the evaluation of patients at risk for COVID-19 are in a state of rapid change based on information released by regulatory bodies including the CDC and federal and state organizations. These policies and algorithms were followed during the patient's care in the ED.   Patient presents with diffuse upper abdominal pain and tenderness which is severe.  Somewhat nonlocalizing.  We will need to obtain CT scan to further evaluate.  Clinical Course as of 02/21/21 1619  Tue Feb 21, 2021  1528 CT findings reviewed, suggestive of small bowel  obstruction.  Patient has no vomiting, no hernia, no surgical history that might create scar tissue.  He had a normal bowel movement yesterday evening and reports passing gas normally throughout today.  Discussed with surgery who recommends p.o. trial and if tolerated patient can be discharged home. [PS]    Clinical Course User Index [PS] Sharman Cheek, MD    ----------------------------------------- 4:19 PM on 02/21/2021 ----------------------------------------- Patient feels much better.  Tolerating liquid diet, without nausea or vomiting.  Stable for discharge home.   ____________________________________________   FINAL CLINICAL IMPRESSION(S) / ED DIAGNOSES    Final diagnoses:  Pain of upper abdomen     ED Discharge Orders          Ordered    senna-docusate (SENOKOT-S) 8.6-50 MG tablet  2 times daily        02/21/21 1617    metoCLOPramide (REGLAN) 10 MG tablet  Every 6 hours PRN        02/21/21 1617    famotidine (PEPCID) 20 MG tablet  2 times daily        02/21/21 1617            Portions of this note were generated with dragon dictation software. Dictation errors may occur despite best attempts at  proofreading.    Sharman Cheek, MD 02/21/21 [phone]

## 2021-02-21 NOTE — ED Notes (Signed)
Pt passed PO challenge with ginger ale.

## 2021-02-21 NOTE — ED Notes (Signed)
Patient transported to CT via stretcher with CT tech. °

## 2021-02-21 NOTE — ED Notes (Signed)
Pt returned from CT via stretcher with CT tech. °

## 2021-02-21 NOTE — ED Notes (Signed)
Pt given gingerale for PO challenge 

## 2021-03-02 ENCOUNTER — Telehealth: Payer: Self-pay

## 2021-03-02 NOTE — Telephone Encounter (Signed)
Left message for patient to return call so we can schedule follow up visit from being seen in ED for cholecystitis.

## 2022-05-10 ENCOUNTER — Ambulatory Visit
Admission: EM | Admit: 2022-05-10 | Discharge: 2022-05-10 | Disposition: A | Discharge status: Home / Self Care | Payer: 59

## 2022-05-10 DIAGNOSIS — J3489 Other specified disorders of nose and nasal sinuses: Secondary | ICD-10-CM

## 2022-05-10 DIAGNOSIS — R6889 Other general symptoms and signs: Secondary | ICD-10-CM

## 2022-05-10 MED ORDER — IPRATROPIUM BROMIDE 0.06 % NA SOLN: 2.0000 | Freq: Four times a day (QID) | NASAL | 0 refills | Status: AC

## 2022-05-10 NOTE — Discharge Instructions (Addendum)
Follow up here or with your primary care provider if your symptoms are worsening or not improving.     

## 2022-05-10 NOTE — ED Triage Notes (Signed)
Pt. Presents to UC w/ c/o a headache, congestion, sore throat, post-nasal drip and body aches for the past 2 days.

## 2022-05-10 NOTE — ED Provider Notes (Signed)
[facility]    CSN: [id number] Arrival date & time: 05/10/22  1220      History   Chief Complaint Chief Complaint  Patient presents with   Cough    Had slight fever, body aches, head ache, cough, sneezing, nasal congestion - Entered by patient   Nasal Congestion   Sore Throat   Generalized Body Aches    HPI Kyle Simmons. is a 41 y.o. male.    Cough Sore Throat    Presents to Urgent care with complaint of headache, congestion, sore throat, postnasal drip, body aches x 2 days.  He states he is not using any over-the-counter medication because of his use of warfarin.  He specifically says that he is not able to take Sudafed.  Past Medical History:  Diagnosis Date   Cerebral brain hemorrhage (HCC)    When patient was 79, he reports he had a brain hemorrhage following a thrombotic ischemic stroke from his heart valve replacement   CVA (cerebral vascular accident) [facility])    Patient reports "throwing a clot" to his brain and having a stroke at age 63 due to heart valve replacement   Gout    Seizures (HCC)    Stroke [facility])    Testicular abscess     Patient Active Problem List   Diagnosis Date Noted   Cerebral brain hemorrhage [facility])     Past Surgical History:  Procedure Laterality Date   AORTIC VALVE REPLACEMENT         Home Medications    Prior to Admission medications   Medication Sig Start Date End Date Taking? Authorizing Provider  AIMOVIG 140 MG/ML SOAJ SMARTSIG:140 Milligram(s) SUB-Q Every 4 Weeks 01/31/22  Yes [provider]  Atogepant 60 MG TABS Take by mouth. 04/24/22 04/24/23 Yes [provider]  fluticasone (FLONASE) 50 MCG/ACT nasal spray Place into the nose. 12/09/20  Yes [provider]  hydrocortisone (ANUSOL-HC) 2.5 % rectal cream Apply topically 3 (three) times daily. 03/09/22  Yes [provider]  acetaminophen (TYLENOL) 500 MG tablet Take by mouth.    [provider]  amoxicillin  (AMOXIL) 500 MG capsule Take 1 capsule (500 mg total) by mouth See admin instructions. Take 4 capsules 1 hour before procedure. 02/01/20   Alfredo Martinez, MD  aspirin 81 MG EC tablet Take by mouth.    [provider]  colchicine 0.6 MG tablet Take 1 tablet every day by oral route.    [provider]  fexofenadine (ALLEGRA) 60 MG tablet Take by mouth.    [provider]  HYDROcodone-acetaminophen (NORCO) 7.5-325 MG tablet Take 1 tablet by mouth every 6 (six) hours as needed. 01/26/20   [provider]  lamoTRIgine (LAMICTAL) 200 MG tablet Take 200 mg by mouth 2 (two) times daily. 12/01/19   [provider]  levETIRAcetam (KEPPRA) 1000 MG tablet Take by mouth. 07/07/19   [provider]  meloxicam (MOBIC) 15 MG tablet     [provider]  metoCLOPramide (REGLAN) 10 MG tablet Take 1 tablet (10 mg total) by mouth every 6 (six) hours as needed. 02/21/21   Sharman Cheek, MD  metoprolol succinate (TOPROL-XL) 25 MG 24 hr tablet Take 25 mg by mouth daily. 11/23/19   [provider]  NURTEC 75 MG TBDP Take by mouth. 01/18/20   [provider]  pantoprazole (PROTONIX) 40 MG tablet Take 1 tablet (40 mg total) by mouth daily. 02/21/21 02/21/22  Sharman Cheek, MD  predniSONE (DELTASONE)  5 MG tablet Take 1 dose pk by oral route.    [provider]  senna-docusate (SENOKOT-S) 8.6-50 MG tablet Take 2 tablets by mouth 2 (two) times daily. 02/21/21   Carrie Mew, MD  simvastatin (ZOCOR) 20 MG tablet Take 20 mg by mouth at bedtime. 11/23/19   [provider]  warfarin (COUMADIN) 5 MG tablet Take 5 mg by mouth daily. 12/21/19   [provider]    Family History History reviewed. No pertinent family history.  Social History Social History   Tobacco Use   Smoking status: Never   Smokeless tobacco: Never  Vaping Use   Vaping Use: Never used  Substance Use Topics   Alcohol use: No   Drug use: Not  Currently     Allergies   Cortisone, Sudafed [pseudoephedrine hcl], and Tramadol   Review of Systems Review of Systems  Respiratory:  Positive for cough.      Physical Exam Triage Vital Signs ED Triage Vitals  Enc Vitals Group     BP 05/10/22 1257 124/81     Pulse Rate 05/10/22 1257 74     Resp 05/10/22 1257 18     Temp 05/10/22 1257 98.6 F (37 C)     Temp Source 05/10/22 1257 Oral     SpO2 05/10/22 1257 96 %     Weight --      Height --      Head Circumference --      Peak Flow --      Pain Score 05/10/22 1302 0     Pain Loc --      Pain Edu? --      Excl. in [place]? --    No data found.  Updated Vital Signs BP 124/81 (BP Location: Left Arm)   Pulse 74   Temp 98.6 F (37 C) (Oral)   Resp 18   SpO2 96%   Visual Acuity Right Eye Distance:   Left Eye Distance:   Bilateral Distance:    Right Eye Near:   Left Eye Near:    Bilateral Near:     Physical Exam Vitals reviewed.  Constitutional:      Appearance: He is well-developed. He is ill-appearing.  HENT:     Mouth/Throat:     Mouth: Mucous membranes are moist.     Pharynx: No oropharyngeal exudate or posterior oropharyngeal erythema.     Tonsils: No tonsillar exudate.  Cardiovascular:     Rate and Rhythm: Normal rate and regular rhythm.     Heart sounds: Normal heart sounds.  Pulmonary:     Effort: Pulmonary effort is normal.     Breath sounds: Normal breath sounds.  Skin:    General: Skin is warm and dry.  Neurological:     General: No focal deficit present.     Mental Status: He is alert and oriented to person, place, and time.  Psychiatric:        Mood and Affect: Mood normal.        Behavior: Behavior normal.      UC Treatments / Results  Labs (all labs ordered are listed, but only abnormal results are displayed) Labs Reviewed - No data to display  EKG   Radiology No results found.  Procedures Procedures (including critical care time)  Medications Ordered in UC Medications -  No data to display  Initial Impression / Assessment and Plan / UC Course  I have reviewed the triage vital signs and the nursing notes.  Pertinent labs & imaging results that were available during my care of the patient were reviewed by me and considered in my medical decision making (see chart for details).   Patient is afebrile here without recent antipyretics. Satting well on room air. Overall is ill appearing, well hydrated, without respiratory distress. Pulmonary exam is unremarkable.  Lungs CTAB without wheezing, rhonchi, rales.  There is no pharyngeal erythema or peritonsillar exudates.  Symptoms are consistent with a viral process including influenza.  He is unclear about the start date of his symptoms and likely outside the window for antiviral treatment.  Recommending use of homeopathic treatments including humidification, nasal saline spray, Vicks vapor rub.  Suggested use of ipratropium nasal spray to reduce postnasal drip and rhinorrhea.  Final Clinical Impressions(s) / UC Diagnoses   Final diagnoses:  None   Discharge Instructions   None    ED Prescriptions   None    PDMP not reviewed this encounter.   Rose Phi, [place] 05/10/22 1331

## 2022-11-20 IMAGING — CT CT HEAD W/O CM
3 series · 15 of 47 positions shown, 18 images · non-contrast
Comparison: MRI 10/13/2019

CLINICAL DATA: Transient ischemic attack

EXAM:
CT HEAD WITHOUT CONTRAST
TECHNIQUE: Contiguous axial images were obtained from the base of the skull
through the vertex without intravenous contrast.

[Series 2: head wo · axial · 0.45mm/px · z∈[-137,+3]mm · 9 of 34 slices shown, 12 images]
[im 3/34  brain]
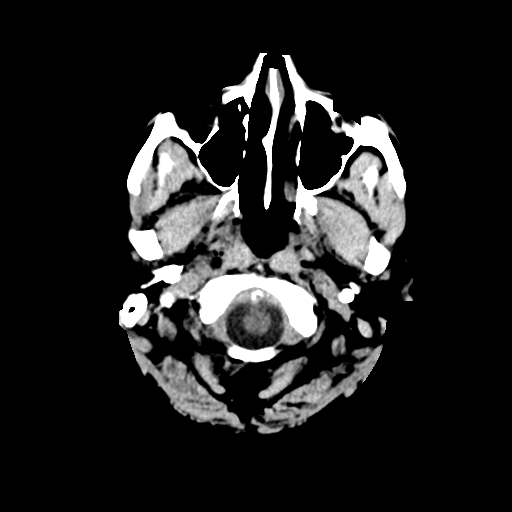
[im 3/34  bone]
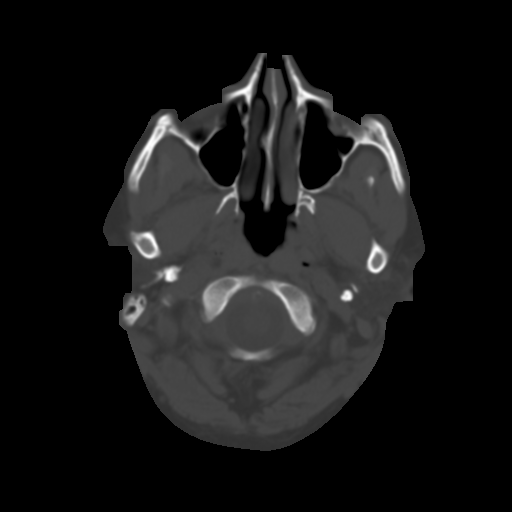
[im 6/34  brain]
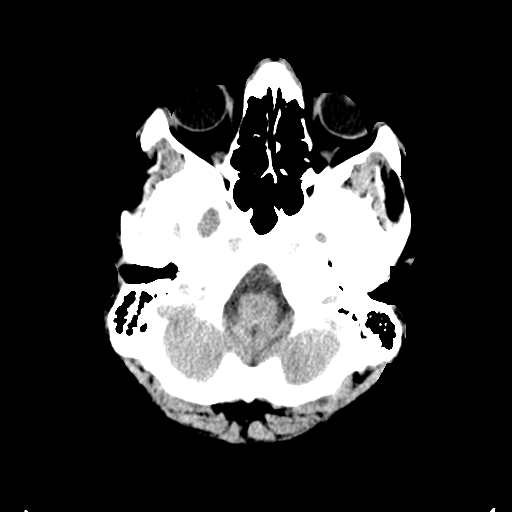
[im 10/34  brain]
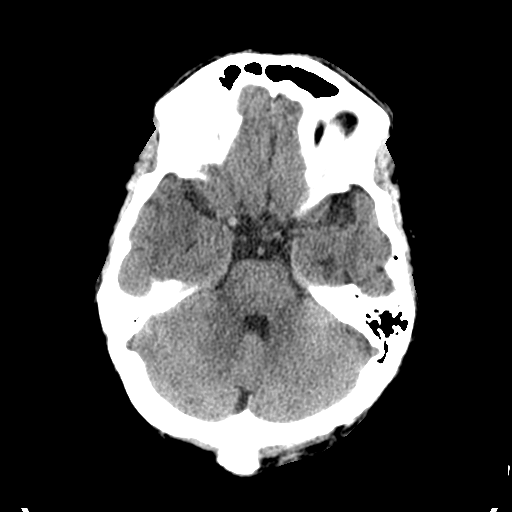
[im 13/34  brain]
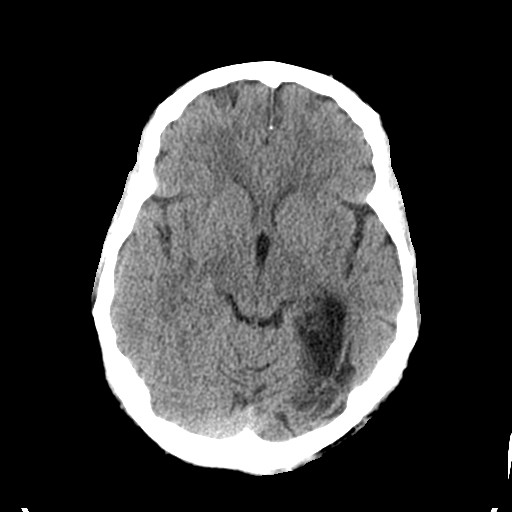
[im 18/34  brain]
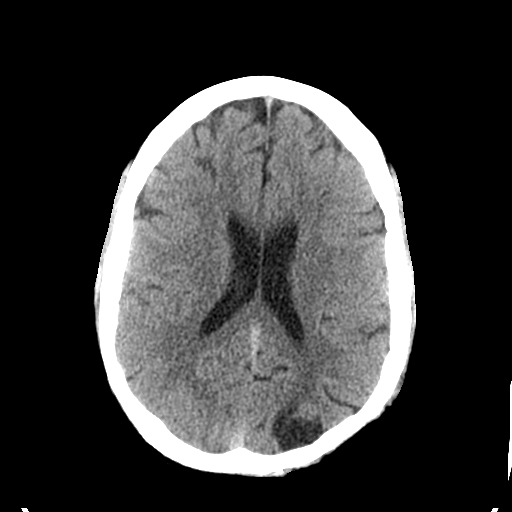
[im 18/34  bone]
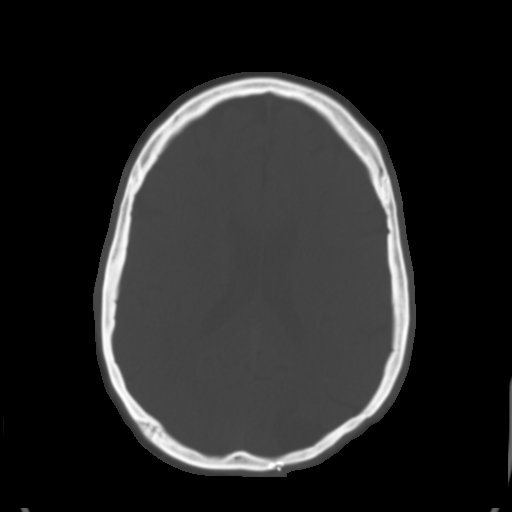
[im 21/34  brain]
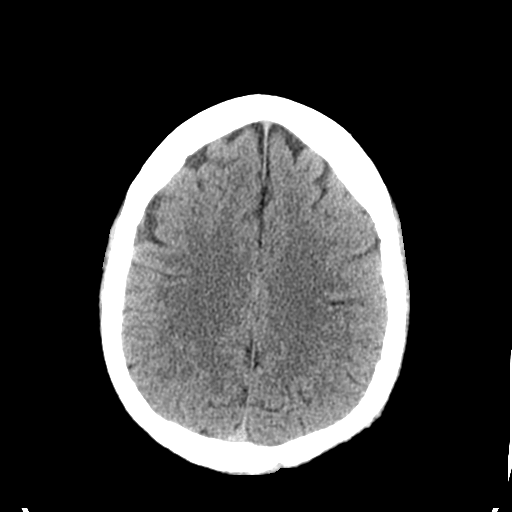
[im 24/34  brain]
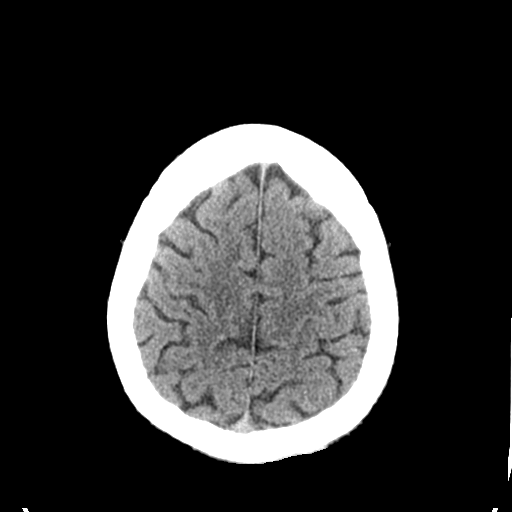
[im 28/34  brain]
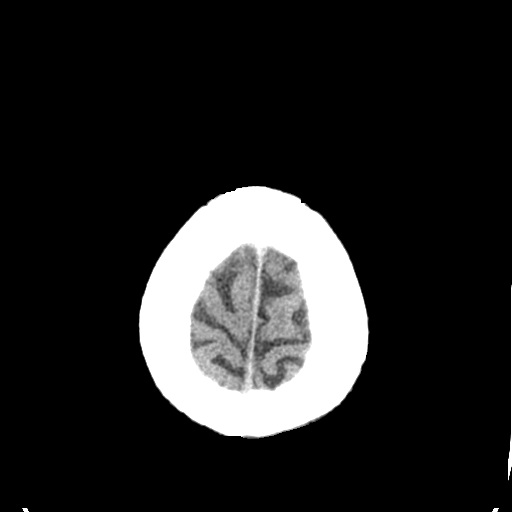
[im 31/34  brain]
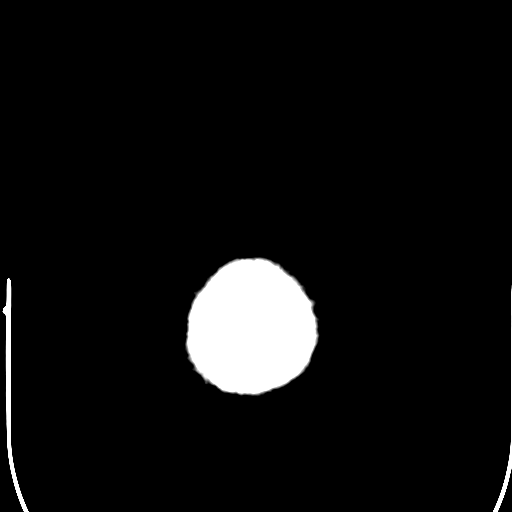
[im 31/34  bone]
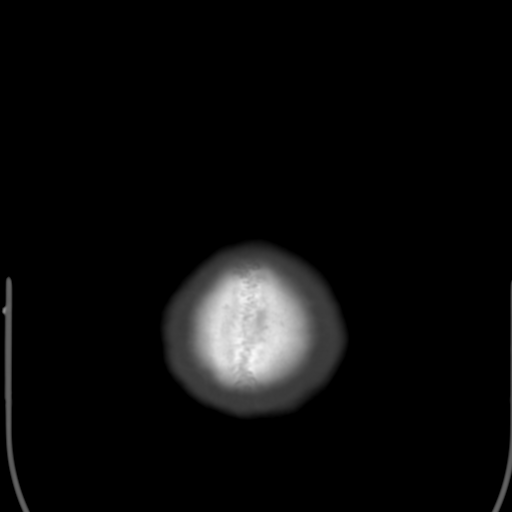

[Series 4: coronal soft tissue · coronal · 0.32mm/px · 3 of 70 slices shown]
[im 24/70  brain]
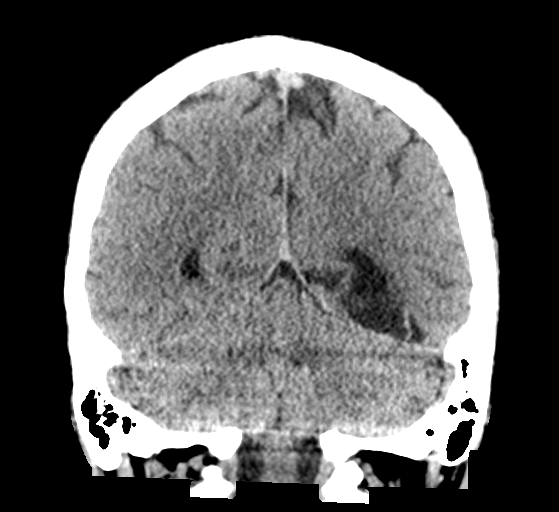
[im 31/70  brain]
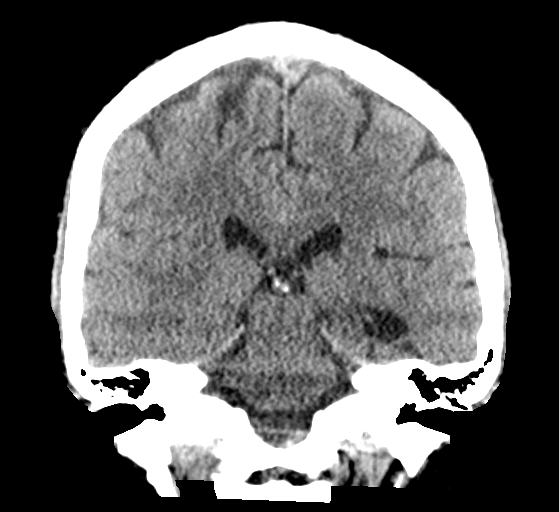
[im 39/70  brain]
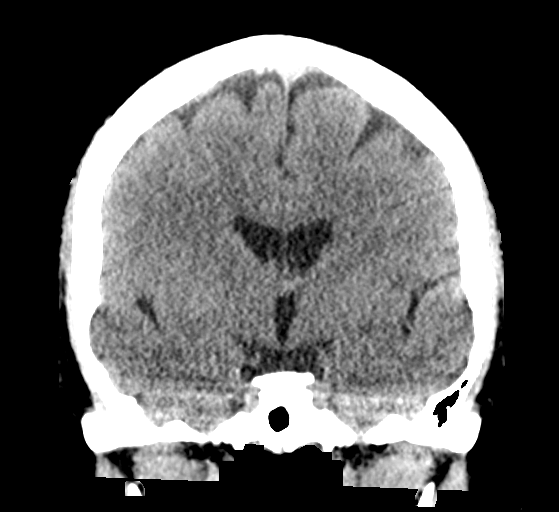

[Series 5: sagittal soft tissue · sagittal · 0.34mm/px · 3 of 61 slices shown]
[im 21/61  brain]
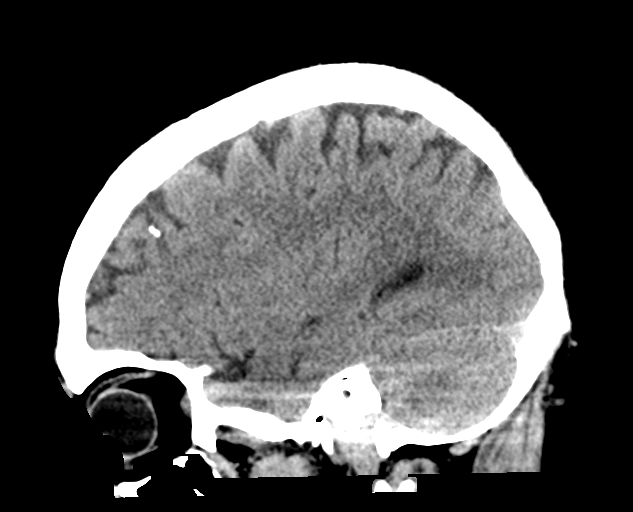
[im 31/61  brain]
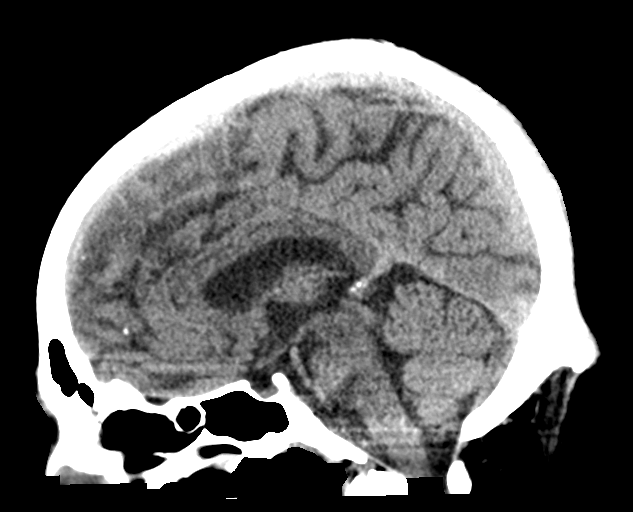
[im 41/61  brain]
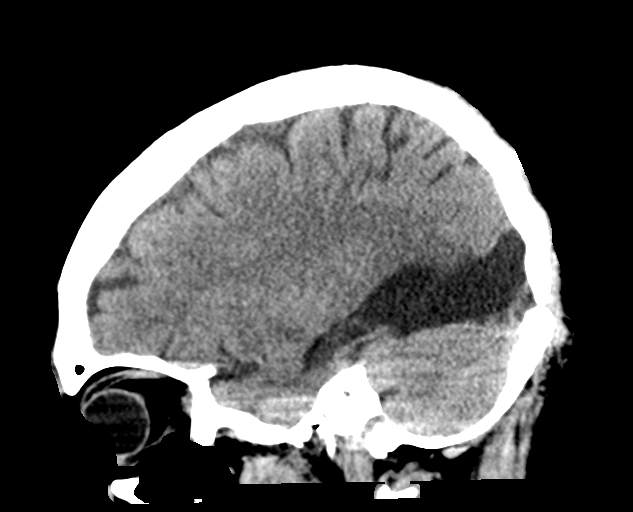

[15 of 47 positions shown; findings below may reference images not displayed]

FINDINGS: Brain: Remote left occipital infarct is again identified. Focal area
of encephalomalacia within the right frontal lobe again noted likely
related to prior ventriculostomy. No evidence of acute intracranial
hemorrhage or infarct. No abnormal mass effect or midline shift. No
abnormal intra or extra-axial mass lesion or fluid collection.
Ventricular size is normal. Cerebellum unremarkable.

Vascular: No asymmetric hyperdense vasculature at the skull base.

Skull: Changes of remote left occipital craniotomy are identified.
No acute fracture.

Sinuses/Orbits: The orbits are unremarkable. Several mucous
retention cysts are partially visualized within the left maxillary
sinus. Remaining paranasal sinuses are clear.

Other: Mastoid air cells and middle ear cavities are clear.
IMPRESSION: No acute intracranial hemorrhage or infarct. Remote left occipital
infarct again noted.
